# Patient Record
Sex: Male | Born: 1950 | Race: White | Hispanic: No | Marital: Married | Smoking: Former smoker
Health system: Southern US, Community
[De-identification: ages and names within clinical notes are randomized; demographics above are authoritative.]

## PROBLEM LIST (undated history)

## (undated) DIAGNOSIS — B029 Zoster without complications: Secondary | ICD-10-CM

## (undated) DIAGNOSIS — Q2730 Arteriovenous malformation, site unspecified: Secondary | ICD-10-CM

## (undated) DIAGNOSIS — K8020 Calculus of gallbladder without cholecystitis without obstruction: Secondary | ICD-10-CM

## (undated) DIAGNOSIS — N210 Calculus in bladder: Secondary | ICD-10-CM

## (undated) DIAGNOSIS — E785 Hyperlipidemia, unspecified: Secondary | ICD-10-CM

## (undated) DIAGNOSIS — N281 Cyst of kidney, acquired: Secondary | ICD-10-CM

## (undated) DIAGNOSIS — I700 Atherosclerosis of aorta: Secondary | ICD-10-CM

## (undated) DIAGNOSIS — K5790 Diverticulosis of intestine, part unspecified, without perforation or abscess without bleeding: Secondary | ICD-10-CM

## (undated) DIAGNOSIS — I10 Essential (primary) hypertension: Secondary | ICD-10-CM

## (undated) DIAGNOSIS — Z87442 Personal history of urinary calculi: Secondary | ICD-10-CM

## (undated) HISTORY — DX: Atherosclerosis of aorta: I70.0

## (undated) HISTORY — DX: Hyperlipidemia, unspecified: E78.5

## (undated) HISTORY — DX: Calculus of gallbladder without cholecystitis without obstruction: K80.20

## (undated) HISTORY — PX: WISDOM TOOTH EXTRACTION: SHX21

## (undated) HISTORY — DX: Calculus in bladder: N21.0

## (undated) HISTORY — DX: Cyst of kidney, acquired: N28.1

## (undated) HISTORY — PX: TORSION APPENDIX EXCISION OF INFRACTED EPIPLOIC: SHX6127

## (undated) HISTORY — DX: Diverticulosis of intestine, part unspecified, without perforation or abscess without bleeding: K57.90

## (undated) HISTORY — DX: Essential (primary) hypertension: I10

## (undated) HISTORY — DX: Arteriovenous malformation, site unspecified: Q27.30

## (undated) HISTORY — DX: Zoster without complications: B02.9

---

## 1982-06-10 HISTORY — PX: OTHER SURGICAL HISTORY: SHX169

## 1999-07-04 ENCOUNTER — Ambulatory Visit (HOSPITAL_BASED_OUTPATIENT_CLINIC_OR_DEPARTMENT_OTHER): Admission: RE | Admit: 1999-07-04 | Discharge: 1999-07-04 | Payer: Self-pay | Admitting: Orthopedic Surgery

## 2000-06-24 ENCOUNTER — Encounter: Admission: RE | Admit: 2000-06-24 | Discharge: 2000-06-24 | Payer: Self-pay | Admitting: Otolaryngology

## 2000-06-24 ENCOUNTER — Encounter: Payer: Self-pay | Admitting: Otolaryngology

## 2001-09-03 ENCOUNTER — Encounter: Admission: RE | Admit: 2001-09-03 | Discharge: 2001-09-03 | Payer: Self-pay | Admitting: Family Medicine

## 2001-09-03 ENCOUNTER — Encounter: Payer: Self-pay | Admitting: Family Medicine

## 2003-04-04 ENCOUNTER — Ambulatory Visit (HOSPITAL_BASED_OUTPATIENT_CLINIC_OR_DEPARTMENT_OTHER): Admission: RE | Admit: 2003-04-04 | Discharge: 2003-04-04 | Payer: Self-pay | Admitting: Orthopedic Surgery

## 2003-04-04 ENCOUNTER — Ambulatory Visit (HOSPITAL_COMMUNITY): Admission: RE | Admit: 2003-04-04 | Discharge: 2003-04-04 | Payer: Self-pay | Admitting: Orthopedic Surgery

## 2003-06-11 HISTORY — PX: KNEE SURGERY: SHX244

## 2005-09-25 ENCOUNTER — Encounter: Admission: RE | Admit: 2005-09-25 | Discharge: 2005-09-25 | Payer: Self-pay | Admitting: Family Medicine

## 2008-04-08 ENCOUNTER — Encounter: Admission: RE | Admit: 2008-04-08 | Discharge: 2008-04-08 | Payer: Self-pay | Admitting: Family Medicine

## 2009-10-13 IMAGING — CR DG LUMBAR SPINE COMPLETE 4+V
5 series · 5 of 5 positions shown · non-contrast
Comparison: None

CLINICAL DATA: Pain in the left mid to low back, no acute injury

LUMBAR SPINE - COMPLETE 4+ VIEW

[view not recorded (1 of 5)]
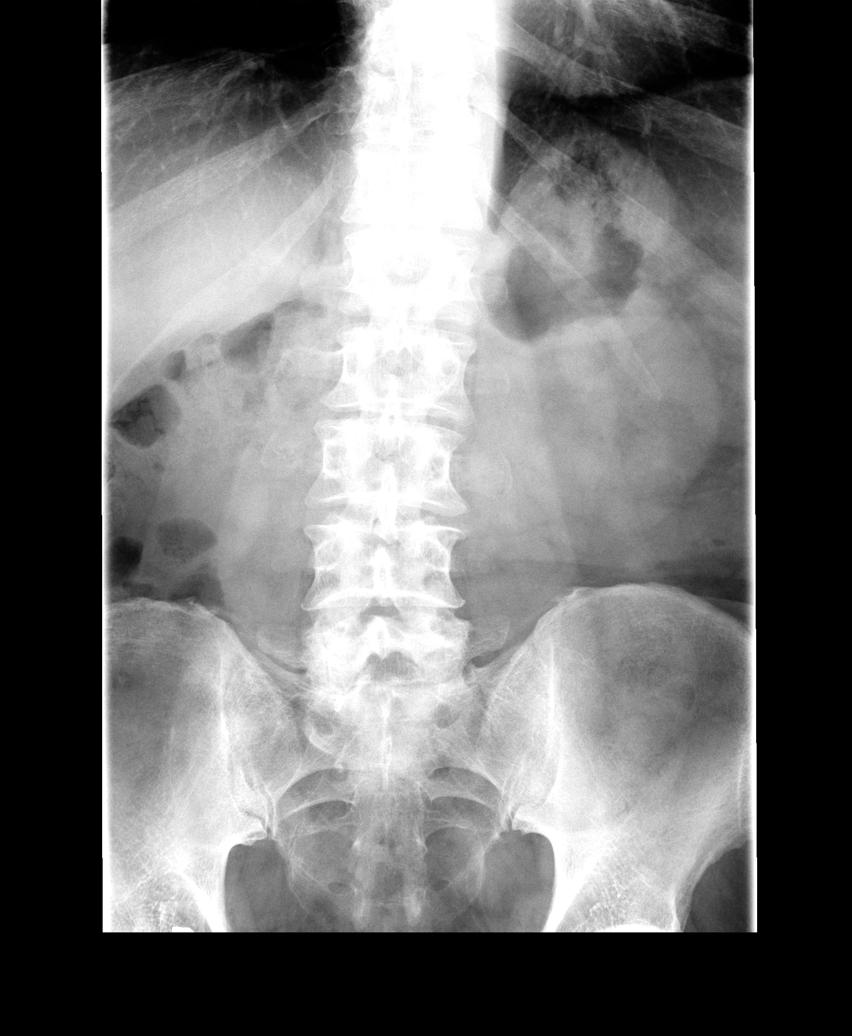

[view not recorded (2 of 5)]
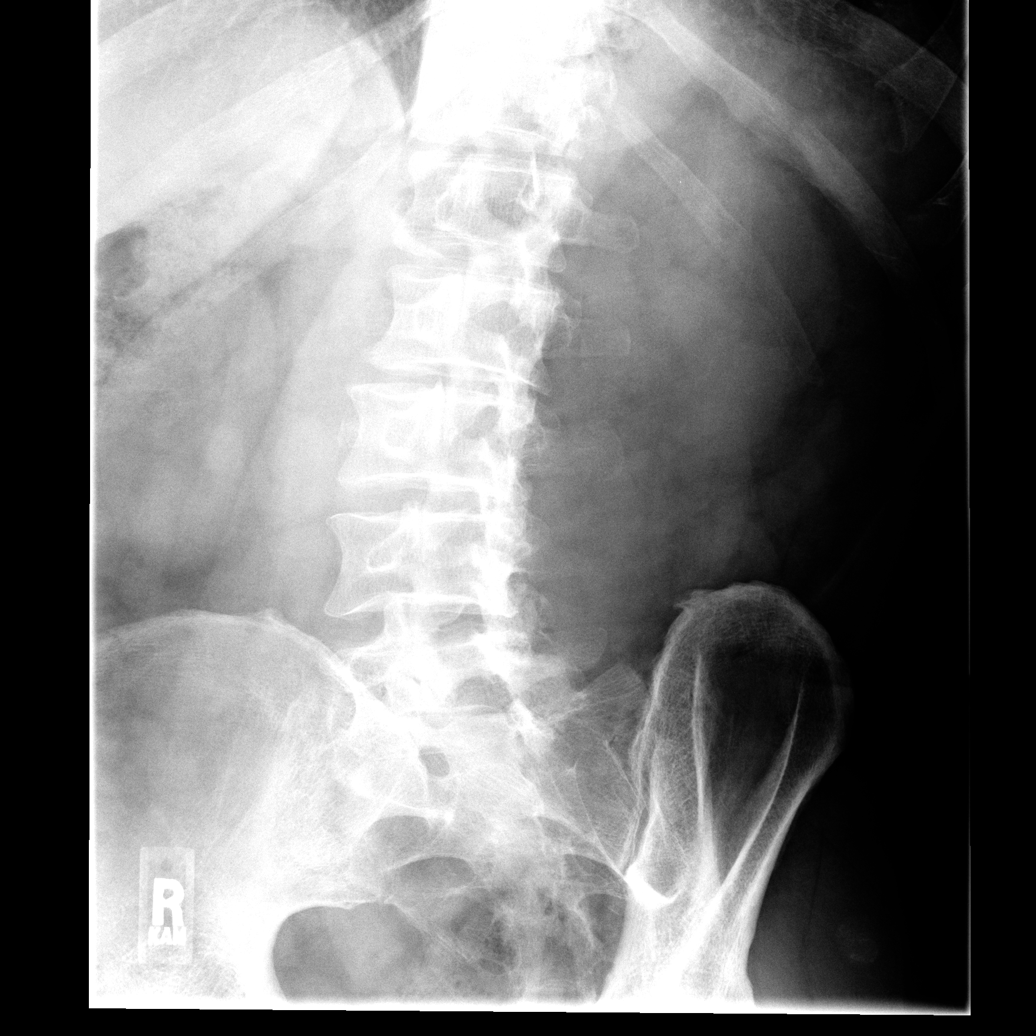

[view not recorded (3 of 5)]
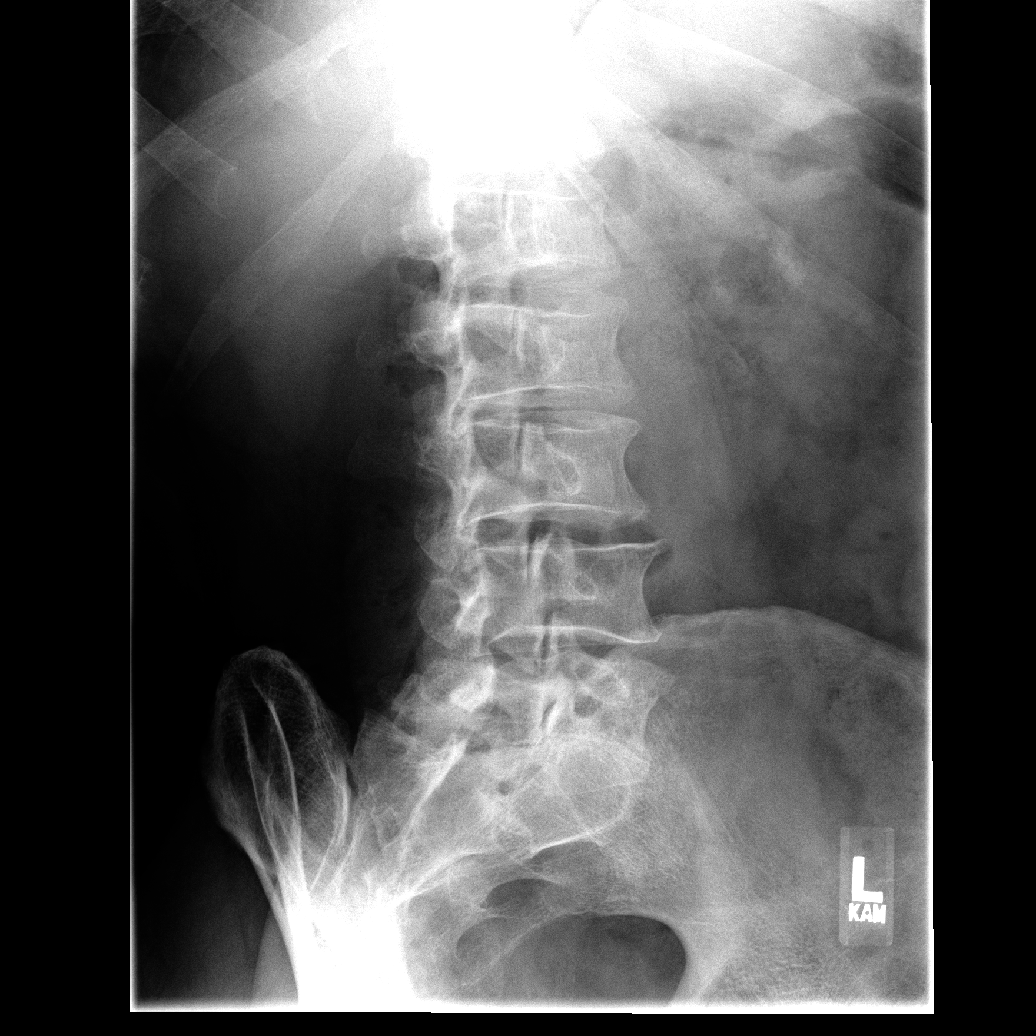

[view not recorded (4 of 5)]
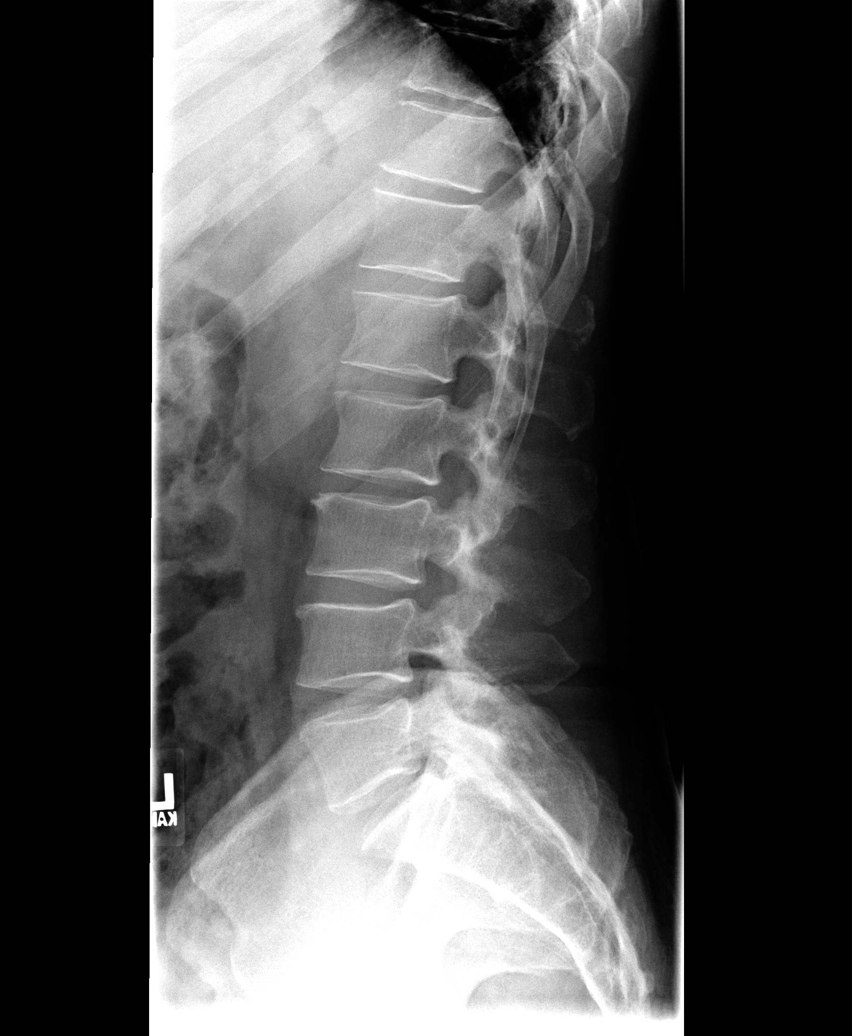

[view not recorded (5 of 5)]
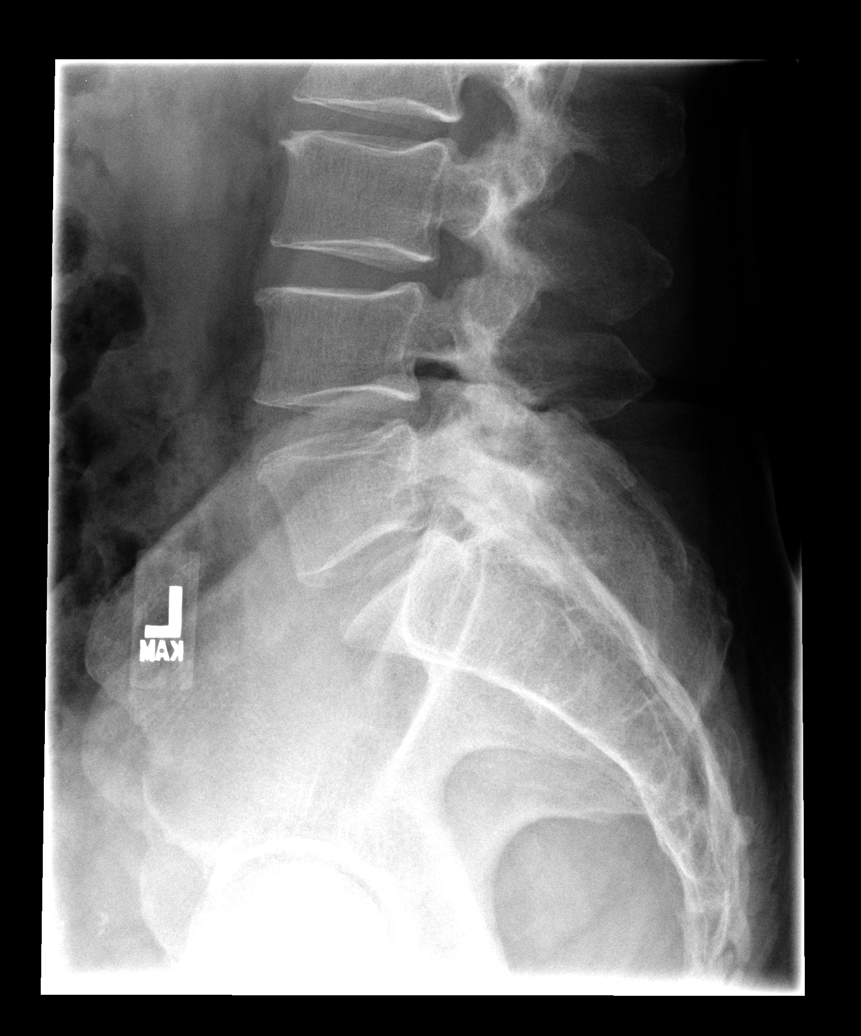

[5 of 5 positions shown; findings below may reference images not displayed]

FINDINGS: The lumbar vertebrae are in normal alignment with normal
disc spaces.  No compression deformity is seen.  The SI joints are
normally corticated.
IMPRESSION: Normal alignment with normal disc spaces.

## 2010-10-26 NOTE — Op Note (Signed)
NAME:  Chris Tucker, Chris Tucker                    ACCOUNT NO.:  [id number]   MEDICAL RECORD NO.:  [id number]                   PATIENT TYPE:  OUT   LOCATION:  [facility]                                 FACILITY:  [facility]   PHYSICIAN:  Feliberto Gottron. Turner Daniels, M.D.                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   OPERATION PERFORMED:  Left knee arthroscopic partial medial meniscectomy,  debridement of chondromalacia.   SURGEON:  Feliberto Gottron. Turner Daniels, M.D.   ASSISTANTLaural Benes. Jannet Mantis.   ANESTHESIA:  Local with intravenous sedation.   ESTIMATED BLOOD LOSS:  Minimal.   FLUIDS REPLACED:  700 mL crystalloids.   DRAINS:  None.   TOURNIQUET TIME:  None.   INDICATIONS FOR PROCEDURE:  The patient is a 60 year old gentleman with a  symptomatic medial meniscal tear of his left knee with effusion.  He has  failed conservative treatment with anti-inflammatory medicine, stretching  exercises and observation.  Because of chronic catching popping, and pain,  he desires elective arthroscopic evaluation and treatment of his left knee  which also has some loss of articular cartilage height on the standing x-  rays, missing about 20 to 30% of the articular cartilage on the standing  films with some peripheral osteophytes.   DESCRIPTION OF PROCEDURE:  The patient was identified by arm band and take  to the operating room at [facility].  Appropriate  anesthetic monitors were attached and local anesthesia with intravenous  sedation was induced with the patient in supine position.  Lateral post was  applied to the table and the left lower extremity was prepped and draped in  the usual sterile fashion from the ankle to the midthigh.  Using a #11  blade, standard inferomedial and inferolateral peripatellar portals were  made allowing introduction of the arthroscope through the inferolateral  portal, the outflow through the inferomedial portal.  Diagnostic arthroscopy  did reveal some grade 2 to 3 chondromalacia of the patella which was lightly  debrided.  The medial femoral condyle did have some flap tears grade 3 to 4  focal over about a 1 cm squared area which were also debrided.  Moving into  the medial compartment itself, we identified a complex degenerative tearing  of the posterior horn of the medial meniscus and this was debrided back to a  stable margin, the anterior cruciate ligament and the posterior cruciate  ligament were noted to be intact.  On the lateral side, minimal  chondromalacia was noted requiring minimal debridement  with 3.5 Gator sucker  shaver which had been used on the other side as well.  At this point the  arthroscope was taken to the medial and lateral gutter clearing these areas  as well.  Small bits of articular cartilage were noted in the joint fluid  and these were continuously lavaged out during the procedure.  We also  visualized the posterior horns of the menisci taking the scope to the medial  and lateral side of the PCL and getting a good look at these two structures.  At this point the knee was washed out with normal saline solution.  The  arthroscopic instruments were removed.  A dressing of Xeroform, 4 x 4  dressing sponges, Webril and Ace wrap was applied.  The patient was awakened  and taken to the recovery room without difficulty.                                               Feliberto Gottron. Turner Daniels, M.D.    Frithjof.Angelica  D:  05/16/2003  T:  05/16/2003  Job:  [id number]

## 2012-07-13 ENCOUNTER — Encounter: Payer: Self-pay | Admitting: Gastroenterology

## 2012-07-21 ENCOUNTER — Encounter: Payer: Self-pay | Admitting: Gastroenterology

## 2012-07-21 ENCOUNTER — Ambulatory Visit (INDEPENDENT_AMBULATORY_CARE_PROVIDER_SITE_OTHER): Payer: 59 | Admitting: Gastroenterology

## 2012-07-21 VITALS — BP 150/90 | HR 80 | Ht 67.0 in | Wt 187.0 lb

## 2012-07-21 DIAGNOSIS — K5900 Constipation, unspecified: Secondary | ICD-10-CM

## 2012-07-21 MED ORDER — PSYLLIUM 58.6 % PO POWD: 1.0000 | Freq: Every day | ORAL | Status: DC

## 2012-07-21 NOTE — Patient Instructions (Addendum)
Please purchase Metamucil over the counter. Take as directed.  Please read information below about constipation and high fiber diet.  Follow up in one year.  ________________________________________________________________________  Constipation, Adult Constipation is when a person has fewer than 3 bowel movements a week; has difficulty having a bowel movement; or has stools that are dry, hard, or larger than normal. As people grow older, constipation is more common. If you try to fix constipation with medicines that make you have a bowel movement (laxatives), the problem may get worse. Long-term laxative use may cause the muscles of the colon to become weak. A low-fiber diet, not taking in enough fluids, and taking certain medicines may make constipation worse. CAUSES   Certain medicines, such as antidepressants, pain medicine, iron supplements, antacids, and water pills.   Certain diseases, such as diabetes, irritable bowel syndrome (IBS), thyroid disease, or depression.   Not drinking enough water.   Not eating enough fiber-rich foods.   Stress or travel.  Lack of physical activity or exercise.  Not going to the restroom when there is the urge to have a bowel movement.  Ignoring the urge to have a bowel movement.  Using laxatives too much. SYMPTOMS   Having fewer than 3 bowel movements a week.   Straining to have a bowel movement.   Having hard, dry, or larger than normal stools.   Feeling full or bloated.   Pain in the lower abdomen.  Not feeling relief after having a bowel movement. DIAGNOSIS  Your caregiver will take a medical history and perform a physical exam. Further testing may be done for severe constipation. Some tests may include:   A barium enema X-ray to examine your rectum, colon, and sometimes, your small intestine.  A sigmoidoscopy to examine your lower colon.  A colonoscopy to examine your entire colon. TREATMENT  Treatment will depend on  the severity of your constipation and what is causing it. Some dietary treatments include drinking more fluids and eating more fiber-rich foods. Lifestyle treatments may include regular exercise. If these diet and lifestyle recommendations do not help, your caregiver may recommend taking over-the-counter laxative medicines to help you have bowel movements. Prescription medicines may be prescribed if over-the-counter medicines do not work.  HOME CARE INSTRUCTIONS   Increase dietary fiber in your diet, such as fruits, vegetables, whole grains, and beans. Limit high-fat and processed sugars in your diet, such as [place] fries, hamburgers, cookies, candies, and soda.   A fiber supplement may be added to your diet if you cannot get enough fiber from foods.   Drink enough fluids to keep your urine clear or pale yellow.   Exercise regularly or as directed by your caregiver.   Go to the restroom when you have the urge to go. Do not hold it.  Only take medicines as directed by your caregiver. Do not take other medicines for constipation without talking to your caregiver first. SEEK IMMEDIATE MEDICAL CARE IF:   You have bright red blood in your stool.   Your constipation lasts for more than 4 days or gets worse.   You have abdominal or rectal pain.   You have thin, pencil-like stools.  You have unexplained weight loss. MAKE SURE YOU:   Understand these instructions.  Will watch your condition.  Will get help right away if you are not doing well or get worse. Document Released: 02/23/2004 Document Revised: 08/19/2011 Document Reviewed: 04/30/2011 [employer] Patient Information 2013 [employer], [place]. _____________________________________________________________________________________________  High-Fiber Diet Fiber is found  in fruits, vegetables, and grains. A high-fiber diet encourages the addition of more whole grains, legumes, fruits, and vegetables in your diet. The recommended amount  of fiber for adult males is 38 g per day. For adult females, it is 25 g per day. Pregnant and lactating women should get 28 g of fiber per day. If you have a digestive or bowel problem, ask your caregiver for advice before adding high-fiber foods to your diet. Eat a variety of high-fiber foods instead of only a select few type of foods.  PURPOSE  To increase stool bulk.  To make bowel movements more regular to prevent constipation.  To lower cholesterol.  To prevent overeating. WHEN IS THIS DIET USED?  It may be used if you have constipation and hemorrhoids.  It may be used if you have uncomplicated diverticulosis (intestine condition) and irritable bowel syndrome.  It may be used if you need help with weight management.  It may be used if you want to add it to your diet as a protective measure against atherosclerosis, diabetes, and cancer. SOURCES OF FIBER  Whole-grain breads and cereals.  Fruits, such as apples, oranges, bananas, berries, prunes, and pears.  Vegetables, such as green peas, carrots, sweet potatoes, beets, broccoli, cabbage, spinach, and artichokes.  Legumes, such split peas, soy, lentils.  Almonds. FIBER CONTENT IN FOODS Starches and Grains / Dietary Fiber (g)  Cheerios, 1 cup / 3 g  Corn Flakes cereal, 1 cup / 0.7 g  Rice crispy treat cereal, 1 cup / 0.3 g  Instant oatmeal (cooked),  cup / 2 g  Frosted wheat cereal, 1 cup / 5.1 g  Brown, long-grain rice (cooked), 1 cup / 3.5 g  White, long-grain rice (cooked), 1 cup / 0.6 g  Enriched macaroni (cooked), 1 cup / 2.5 g Legumes / Dietary Fiber (g)  Baked beans (canned, plain, or vegetarian),  cup / 5.2 g  Kidney beans (canned),  cup / 6.8 g  Pinto beans (cooked),  cup / 5.5 g Breads and Crackers / Dietary Fiber (g)  Plain or honey graham crackers, 2 squares / 0.7 g  Saltine crackers, 3 squares / 0.3 g  Plain, salted pretzels, 10 pieces / 1.8 g  Whole-wheat bread, 1 slice / 1.9  g  White bread, 1 slice / 0.7 g  Raisin bread, 1 slice / 1.2 g  Plain bagel, 3 oz / 2 g  Flour tortilla, 1 oz / 0.9 g  Corn tortilla, 1 small / 1.5 g  Hamburger or hotdog bun, 1 small / 0.9 g Fruits / Dietary Fiber (g)  Apple with skin, 1 medium / 4.4 g  Sweetened applesauce,  cup / 1.5 g  Banana,  medium / 1.5 g  Grapes, 10 grapes / 0.4 g  Orange, 1 small / 2.3 g  Raisin, 1.5 oz / 1.6 g  Melon, 1 cup / 1.4 g Vegetables / Dietary Fiber (g)  Green beans (canned),  cup / 1.3 g  Carrots (cooked),  cup / 2.3 g  Broccoli (cooked),  cup / 2.8 g  Peas (cooked),  cup / 4.4 g  Mashed potatoes,  cup / 1.6 g  Lettuce, 1 cup / 0.5 g  Corn (canned),  cup / 1.6 g  Tomato,  cup / 1.1 g Document Released: 05/27/2005 Document Revised: 11/26/2011 Document Reviewed: 08/29/2011 [facility] Patient Information 2013 [place], [place].

## 2012-07-21 NOTE — Progress Notes (Signed)
History of Present Illness:  This is a 62 year old Caucasian male referred for evaluation of an acute episode of constipation 2 weeks ago which required several laxatives for relief.  Generalyl this patient has a bowel movement every 3 days,  However during this most recent episode of constipation, he went some 10 days without a bowel movement, but finally had a spontaneous bowel movement with MiraLax and magnesium citrate.  Throughout this period he had no abdominal pain, distention, nausea vomiting, or other GI complaints.  He follows a regular diet and denies any food intolerances, anorexia, weight loss, history of hepatitis or pancreatitis.  He allegedly uses liberal by mouth fluids.  He has not had previous colonoscopy or barium enemas.  Review of notes from Dr. Ricki Miller shows a normal recent rectal exam was Hemoccult negative cards.  Labs showed normal CBC and metabolic profile.  Family history is noncontributory.  Patient denies abuse of alcohol, cigarettes, or NSAIDs.  I have reviewed this patient's present history, medical and surgical past history, allergies and medications.     ROS:   All systems were reviewed and are negative unless otherwise stated in the HPI.  Allergies  Allergen Reactions  . Statins     Flulike symptoms   Outpatient Prescriptions Prior to Visit  Medication Sig Dispense Refill  . magnesium oxide (MAG-OX) 400 MG tablet Take 400 mg by mouth daily.      . Multiple Vitamins-Minerals (MULTI FOR HIM 50+) TABS Take 1 tablet by mouth daily.      Marland Kitchen olmesartan (BENICAR) 40 MG tablet Take 20 mg by mouth as directed.       . OMEGA 3 1000 MG CAPS Take 1 capsule by mouth daily.      . Red Yeast Rice 600 MG CAPS Take 1 capsule by mouth daily.      . Tamsulosin HCl (FLOMAX) 0.4 MG CAPS Take 0.4 mg by mouth as directed.      . bisacodyl (DULCOLAX) 5 MG EC tablet Take 5 mg by mouth as directed.       No facility-administered medications prior to visit.   Past Medical History   Diagnosis Date  . Hypertension   . Hyperlipemia   . Shingles    Past Surgical History  Procedure Laterality Date  . Knee surgery  2005  . Torsion appendix excision of infracted epiploic    . Wisdom tooth extraction     History   Social History  . Marital Status: Single    Spouse Name: N/A    Number of Children: 1  . Years of Education: N/A   Occupational History  . Retired    Social History Main Topics  . Smoking status: Former Games developer  . Smokeless tobacco: Never Used  . Alcohol Use: Yes  . Drug Use: No  . Sexually Active: None   Other Topics Concern  . None   Social History Narrative  . None   Family History  Problem Relation Age of Onset  . Bone cancer Father   . Ovarian cancer Mother        Physical Exam: Blood pressure 150/90, pulse 80 and regular, weight 187 with a BMI of 29.28. General well developed well nourished patient in no acute distress, appearing their stated age Eyes PERRLA, no icterus, fundoscopic exam per opthamologist Skin no lesions noted Neck supple, no adenopathy, no thyroid enlargement, no tenderness Chest clear to percussion and auscultation Heart no significant murmurs, gallops or rubs noted Abdomen no hepatosplenomegaly masses or  tenderness, BS normal. . Extremities no acute joint lesions, edema, phlebitis or evidence of cellulitis. Neurologic patient oriented x 3, cranial nerves intact, no focal neurologic deficits noted. Psychological mental status normal and normal affect.  Assessment and plan: This patient is comfortable with a bowel movement every 3 days which has been normal for him for some time.  I am not exactly sure what caused his most recent episode of acute constipation, and I've advised him to follow a high fiber diet with daily Metamucil and liberal by mouth fluids.  He refuses colonoscopy exam because of complication that his brother had at the time of his colonoscopy.  I have reviewed  Other possible tests to exclude  colon cancer as barium enema or CT colonoscopy, and the patient is content with his current condition.  I've advised him to call me if his constipation worsens or does not improve with Metamucil and liberal by mouth fluids.  I again have explained to him his 5% chance of colon cancer during his lifetime, and a 20-25% possibility of colonic polyposis at his age.  His continue his medical followup with Dr. Ricki Miller who has provided him with excellent medical care. No diagnosis found.

## 2013-05-14 ENCOUNTER — Ambulatory Visit (INDEPENDENT_AMBULATORY_CARE_PROVIDER_SITE_OTHER): Payer: 59 | Admitting: Gastroenterology

## 2013-05-14 ENCOUNTER — Encounter: Payer: Self-pay | Admitting: Gastroenterology

## 2013-05-14 VITALS — BP 130/80 | HR 102 | Ht 67.0 in | Wt 189.4 lb

## 2013-05-14 DIAGNOSIS — K5900 Constipation, unspecified: Secondary | ICD-10-CM

## 2013-05-14 DIAGNOSIS — R109 Unspecified abdominal pain: Secondary | ICD-10-CM

## 2013-05-14 MED ORDER — LINACLOTIDE 145 MCG PO CAPS: 145.0000 ug | ORAL_CAPSULE | Freq: Every day | ORAL | Status: DC

## 2013-05-14 MED ORDER — NA SULFATE-K SULFATE-MG SULF 17.5-3.13-1.6 GM/177ML PO SOLN: ORAL | Status: DC

## 2013-05-14 NOTE — Progress Notes (Signed)
This is a 62 year old Caucasian male who has increasing constipation over the last 10-12 months with some associated left upper quadrant discomfort.  He recently was taken off of Benicar and placed on a beta blocker for his blood pressure control, and says that his bowels are doing better.  He's had some bright red blood locally, but denies blood mixed with his stool.  He has had great reservations about colonoscopy because of ache colonic perforation that occurred in his brother at the time of his brothers colonoscopy.  He now relates that he has" gotten over that", and would like colonoscopy exam.  He continues to GERD several days to week between bowel movements, but denies nausea vomiting, upper GI or hepatobiliary complaints.  He tries to follow a high fiber diet and drinks liberal by mouth fluids.  Again, he has not had colonoscopy other types of studies of his abdomen such as CT scans or barium enemas.  Is not on narcotics or other constipation medications.  No other symptoms of neuromuscular dysfunction or neurological difficulties.  Current Medications, Allergies, Past Medical History, Past Surgical History, Family History and Social History were reviewed in [employer] record.  ROS: All systems were reviewed and are negative unless otherwise stated in the HPI.          Physical Exam: Awake and alert appears stated age.  Blood pressure 130/80, pulse 102 and weight 189 with a BMI of 29.66.  His abdomen shows no distention, organomegaly, masses or tenderness.  Specks of rectum is unremarkable as is rectal exam with good rectal tone.  There is soft stool present which is guaiac negative.  Mental status is normal    Assessment and Plan: Chronic functional constipation in a 62 year old Caucasian male, probable diverticulosis, rule out colon carcinoma.  I've scheduled him for diagnostic colonoscopy, have encouraged him to continue his high fiber diet, have added Benefiber 1  tablespoon with his food twice a day, a trial of Linzess 145 mcg a day, and we will proceed accordingly.  His continue his other medications as per primary care.  He relates that he had recent labs done in July which were all normal.  I cannot find any information concerning constipation association with Benicar use.

## 2013-05-14 NOTE — Patient Instructions (Addendum)
You have been scheduled for a colonoscopy with propofol. Please follow written instructions given to you at your visit today.  Please pick up your prep kit at the pharmacy within the next 1-3 days. If you use inhalers (even only as needed), please bring them with you on the day of your procedure. Your physician has requested that you go to www.startemmi.com and enter the access code given to you at your visit today. This web site gives a general overview about your procedure. However, you should still follow specific instructions given to you by our office regarding your preparation for the procedure..  Please purchase Benefiber over the counter and take twice daily.   We have given you samples of the following medication to take: Linzess 145 mcg, please take one tablet by mouth once daily on an empty stomach

## 2013-05-18 ENCOUNTER — Encounter: Payer: Self-pay | Admitting: Gastroenterology

## 2013-05-28 ENCOUNTER — Ambulatory Visit (AMBULATORY_SURGERY_CENTER): Payer: 59 | Admitting: Gastroenterology

## 2013-05-28 ENCOUNTER — Encounter: Payer: Self-pay | Admitting: Gastroenterology

## 2013-05-28 VITALS — BP 152/76 | HR 54 | Temp 98.0°F | Resp 16 | Ht 67.0 in | Wt 189.0 lb

## 2013-05-28 DIAGNOSIS — R1012 Left upper quadrant pain: Secondary | ICD-10-CM

## 2013-05-28 DIAGNOSIS — K5900 Constipation, unspecified: Secondary | ICD-10-CM

## 2013-05-28 DIAGNOSIS — K5520 Angiodysplasia of colon without hemorrhage: Secondary | ICD-10-CM

## 2013-05-28 DIAGNOSIS — K5730 Diverticulosis of large intestine without perforation or abscess without bleeding: Secondary | ICD-10-CM

## 2013-05-28 DIAGNOSIS — Z1211 Encounter for screening for malignant neoplasm of colon: Secondary | ICD-10-CM

## 2013-05-28 MED ORDER — SODIUM CHLORIDE 0.9 % IV SOLN: 500.0000 mL | INTRAVENOUS | Status: DC

## 2013-05-28 NOTE — Progress Notes (Signed)
Patient did not experience any of the following events: a burn prior to discharge; a fall within the facility; wrong site/side/patient/procedure/implant event; or a hospital transfer or hospital admission upon discharge from the facility. (G8907) Patient did not have preoperative order for IV antibiotic SSI prophylaxis. (G8918)  

## 2013-05-28 NOTE — Patient Instructions (Signed)
Discharge instructions given with verbal understanding. Handouts on diverticulosis and a high fiber diet. Resume previous medications. YOU HAD AN ENDOSCOPIC PROCEDURE TODAY AT THE [facility]: Refer to the procedure report that was given to you for any specific questions about what was found during the examination.  If the procedure report does not answer your questions, please call your gastroenterologist to clarify.  If you requested that your care partner not be given the details of your procedure findings, then the procedure report has been included in a sealed envelope for you to review at your convenience later.  YOU SHOULD EXPECT: Some feelings of bloating in the abdomen. Passage of more gas than usual.  Walking can help get rid of the air that was put into your GI tract during the procedure and reduce the bloating. If you had a lower endoscopy (such as a colonoscopy or flexible sigmoidoscopy) you may notice spotting of blood in your stool or on the toilet paper. If you underwent a bowel prep for your procedure, then you may not have a normal bowel movement for a few days.  DIET: Your first meal following the procedure should be a light meal and then it is ok to progress to your normal diet.  A half-sandwich or bowl of soup is an example of a good first meal.  Heavy or fried foods are harder to digest and may make you feel nauseous or bloated.  Likewise meals heavy in dairy and vegetables can cause extra gas to form and this can also increase the bloating.  Drink plenty of fluids but you should avoid alcoholic beverages for 24 hours.  ACTIVITY: Your care partner should take you home directly after the procedure.  You should plan to take it easy, moving slowly for the rest of the day.  You can resume normal activity the day after the procedure however you should NOT DRIVE or use heavy machinery for 24 hours (because of the sedation medicines used during the test).    SYMPTOMS TO REPORT  IMMEDIATELY: A gastroenterologist can be reached at any hour.  During normal business hours, 8:30 AM to 5:00 PM Monday through Friday, call [phone].  After hours and on weekends, please call the GI answering service at [phone] who will take a message and have the physician on call contact you.   Following lower endoscopy (colonoscopy or flexible sigmoidoscopy):  Excessive amounts of blood in the stool  Significant tenderness or worsening of abdominal pains  Swelling of the abdomen that is new, acute  Fever of 100F or higher FOLLOW UP: If any biopsies were taken you will be contacted by phone or by letter within the next 1-3 weeks.  Call your gastroenterologist if you have not heard about the biopsies in 3 weeks.  Our staff will call the home number listed on your records the next business day following your procedure to check on you and address any questions or concerns that you may have at that time regarding the information given to you following your procedure. This is a courtesy call and so if there is no answer at the home number and we have not heard from you through the emergency physician on call, we will assume that you have returned to your regular daily activities without incident.  SIGNATURES/CONFIDENTIALITY: You and/or your care partner have signed paperwork which will be entered into your electronic medical record.  These signatures attest to the fact that that the information above on your After   Visit Summary has been reviewed and is understood.  Full responsibility of the confidentiality of this discharge information lies with you and/or your care-partner. 

## 2013-05-28 NOTE — Progress Notes (Signed)
Pt very sleepy upon arrival from room. Very difficult to arouse. Wife at bedside and trying to arouse. Pt will open eyes barely and inconsistently follow commands. Tyrone Sage, CRNA in and performed jaw thrust, to which patient arched back and moaned. Dr. Jarold Motto and Kathlene November, CRNA notified of patient condition. CBG requested. Obtained and CBG 82. Dr. Jarold Motto in to assess, pt following commands slowly but with bilateral weak grips, wiggles toes, and slowly answering questions with incomplete words. Pupils equal. IV restarted with fluids wide open. Pt later having some complaints of abdominal pain, slow to express air but finally with success. Dr. Jarold Motto and Kathlene November continue to come in to assess patient regularly and with time patient showing improvement and becoming more appropriately responsive without complaints of pain.

## 2013-05-28 NOTE — Op Note (Signed)
[facility] [address] [place], [address]   COLONOSCOPY PROCEDURE REPORT  PATIENT: Chris Tucker, Chris Tucker  MR#: [id number] BIRTHDATE: [birth year] , 62  yrs. old GENDER: Male ENDOSCOPIST: Mardella Layman, MD, [facility] REFERRED BY: PROCEDURE DATE:  05/28/2013 PROCEDURE:   Colonoscopy, screening First Screening Colonoscopy - Avg.  risk and is 50 yrs.  old or older Yes.  Prior Negative Screening - Now for repeat screening. N/A  History of Adenoma - Now for follow-up colonoscopy & has been > or = to 3 yrs.  N/A ASA CLASS:   Class II INDICATIONS:average risk screening and abdominal pain in the upper left quadrant. MEDICATIONS: Propofol (Diprivan) 230 mg IV  DESCRIPTION OF PROCEDURE:   After the risks benefits and alternatives of the procedure were thoroughly explained, informed consent was obtained.  A digital rectal exam revealed no abnormalities of the rectum.   The LB [id number]  endoscope was introduced through the anus and advanced to the cecum, which was identified by both the appendix and ileocecal valve. No adverse events experienced.   The quality of the prep was excellent, using MoviPrep  The instrument was then slowly withdrawn as the colon was fully examined.      COLON FINDINGS: Diverticulosis was noted.   Mild diverticulosis was noted in the sigmoid colon and descending colon.   A normal appearing cecum, ileocecal valve, and appendiceal orifice were identified.  The ascending, hepatic flexure, transverse, splenic flexure, descending, sigmoid colon and rectum appeared unremarkable.  No polyps or cancers were seen.  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  Retroflexed views revealed no abnormalities. The time to cecum=3 minutes 37 seconds.  Withdrawal time=6 minutes 31 seconds. The scope was withdrawn and the procedure completed. COMPLICATIONS: There were no complications.  ENDOSCOPIC  IMPRESSION: 1.   Incidental cecal AVM 2.   Mild diverticulosis was noted in the sigmoid colon and descending colon 3.   Normal colon...no polyps noted  RECOMMENDATIONS: 1.  High fiber diet with liberal fluid intake. 2.  Metamucil or benefiber 3.  Repeat Colonscopy in 10 years.   eSigned:  Mardella Layman, MD, [facility] 05/28/2013 1:53 PM   cc:

## 2013-05-28 NOTE — Progress Notes (Signed)
Pt. States he has had had vague discomfort left upper quadrant below ribs for approx. 3 weeks.

## 2013-05-28 NOTE — Progress Notes (Signed)
Procedure ends, to recovery, report given and VSS. 

## 2013-05-28 NOTE — Progress Notes (Signed)
Patient sitting up on side of bed. Alert and oriented. Answer questions appropriated. Color pink. Patient and wife having a one on one conversation. Able to move all extremities with any difficulty. Grip strength equal. Received 600cc of NS.

## 2013-05-31 ENCOUNTER — Telehealth: Payer: Self-pay | Admitting: *Deleted

## 2013-05-31 NOTE — Telephone Encounter (Signed)
  Follow up Call-  Call back number 05/28/2013  Post procedure Call Back phone  # [phone]  Permission to leave phone message Yes     Patient questions:  Do you have a fever, pain , or abdominal swelling? no Pain Score  0 *  Have you tolerated food without any problems? yes  Have you been able to return to your normal activities? yes  Do you have any questions about your discharge instructions: Diet   no Medications  no Follow up visit  no  Do you have questions or concerns about your Care? no  Actions: * If pain score is 4 or above: No action needed, pain <4.

## 2013-08-25 ENCOUNTER — Other Ambulatory Visit: Payer: Self-pay | Admitting: Internal Medicine

## 2013-08-25 ENCOUNTER — Ambulatory Visit
Admission: RE | Admit: 2013-08-25 | Discharge: 2013-08-25 | Disposition: A | Discharge status: Home / Self Care | Payer: 59 | Source: Ambulatory Visit | Attending: Internal Medicine | Admitting: Internal Medicine

## 2013-08-25 DIAGNOSIS — N50812 Left testicular pain: Secondary | ICD-10-CM

## 2013-08-25 DIAGNOSIS — N4400 Torsion of testis, unspecified: Secondary | ICD-10-CM

## 2014-02-01 ENCOUNTER — Other Ambulatory Visit: Payer: Self-pay | Admitting: Internal Medicine

## 2014-02-01 ENCOUNTER — Ambulatory Visit
Admission: RE | Admit: 2014-02-01 | Discharge: 2014-02-01 | Disposition: A | Discharge status: Home / Self Care | Payer: 59 | Source: Ambulatory Visit | Attending: Internal Medicine | Admitting: Internal Medicine

## 2014-02-01 DIAGNOSIS — W19XXXA Unspecified fall, initial encounter: Secondary | ICD-10-CM

## 2014-07-09 ENCOUNTER — Emergency Department (HOSPITAL_COMMUNITY)
Admission: EM | Admit: 2014-07-09 | Discharge: 2014-07-09 | Disposition: A | Discharge status: Home / Self Care | Payer: 59 | Attending: Emergency Medicine | Admitting: Emergency Medicine

## 2014-07-09 ENCOUNTER — Emergency Department (HOSPITAL_COMMUNITY): Payer: 59

## 2014-07-09 ENCOUNTER — Encounter (HOSPITAL_COMMUNITY): Payer: Self-pay

## 2014-07-09 DIAGNOSIS — N23 Unspecified renal colic: Secondary | ICD-10-CM | POA: Diagnosis not present

## 2014-07-09 DIAGNOSIS — I10 Essential (primary) hypertension: Secondary | ICD-10-CM | POA: Diagnosis not present

## 2014-07-09 DIAGNOSIS — Z87891 Personal history of nicotine dependence: Secondary | ICD-10-CM | POA: Insufficient documentation

## 2014-07-09 DIAGNOSIS — Z79899 Other long term (current) drug therapy: Secondary | ICD-10-CM | POA: Insufficient documentation

## 2014-07-09 DIAGNOSIS — R109 Unspecified abdominal pain: Secondary | ICD-10-CM

## 2014-07-09 DIAGNOSIS — Z8619 Personal history of other infectious and parasitic diseases: Secondary | ICD-10-CM | POA: Diagnosis not present

## 2014-07-09 DIAGNOSIS — E785 Hyperlipidemia, unspecified: Secondary | ICD-10-CM | POA: Insufficient documentation

## 2014-07-09 LAB — I-STAT CHEM 8, ED
BUN: 20 mg/dL (ref 6–23)
CALCIUM ION: 1.1 mmol/L — AB (ref 1.13–1.30)
CHLORIDE: 108 mmol/L (ref 96–112)
CREATININE: 1.2 mg/dL (ref 0.50–1.35)
GLUCOSE: 110 mg/dL — AB (ref 70–99)
HCT: 47 % (ref 39.0–52.0)
HEMOGLOBIN: 16 g/dL (ref 13.0–17.0)
Potassium: 3.9 mmol/L (ref 3.5–5.1)
SODIUM: 142 mmol/L (ref 135–145)
TCO2: 20 mmol/L (ref 0–100)

## 2014-07-09 LAB — URINALYSIS, ROUTINE W REFLEX MICROSCOPIC
Bilirubin Urine: NEGATIVE
GLUCOSE, UA: NEGATIVE mg/dL
KETONES UR: NEGATIVE mg/dL
Leukocytes, UA: NEGATIVE
Nitrite: NEGATIVE
PROTEIN: NEGATIVE mg/dL
Specific Gravity, Urine: 1.026 (ref 1.005–1.030)
UROBILINOGEN UA: 0.2 mg/dL (ref 0.0–1.0)
pH: 5.5 (ref 5.0–8.0)

## 2014-07-09 LAB — URINE MICROSCOPIC-ADD ON

## 2014-07-09 MED ORDER — OXYCODONE-ACETAMINOPHEN 5-325 MG PO TABS: 1.0000 | ORAL_TABLET | Freq: Four times a day (QID) | ORAL | Status: DC | PRN

## 2014-07-09 MED ORDER — METOCLOPRAMIDE HCL 10 MG PO TABS: 10.0000 mg | ORAL_TABLET | Freq: Four times a day (QID) | ORAL | Status: DC | PRN

## 2014-07-09 NOTE — ED Notes (Signed)
Pt sts that he started to have L flank pain that started approx 2 hrs PTA. Pt denies hematuria or difficulty urinating. Pt is A&O and in NAD, Dr Shela CommonsJ at bedside

## 2014-07-09 NOTE — Discharge Instructions (Signed)
Kidney Stones Take Advil for mild pain or the pain medicine prescribed for bad pain. You've also been prescribed medicine for nausea. Call Dr.Manny's office in 2 days to arrange to be seen within the next 7-10 days, or the next available appointment. Kidney stones (urolithiasis) are solid masses that form inside your kidneys. The intense pain is caused by the stone moving through the kidney, ureter, bladder, and urethra (urinary tract). When the stone moves, the ureter starts to spasm around the stone. The stone is usually passed in your pee (urine).  HOME CARE  Drink enough fluids to keep your pee clear or pale yellow. This helps to get the stone out.  Strain all pee through the provided strainer. Do not pee without peeing through the strainer, not even once. If you pee the stone out, catch it in the strainer. The stone may be as small as a grain of salt. Take this to your doctor. This will help your doctor figure out what you can do to try to prevent more kidney stones.  Only take medicine as told by your doctor.  Follow up with your doctor as told.  Get follow-up X-rays as told by your doctor. GET HELP IF: You have pain that gets worse even if you have been taking pain medicine. GET HELP RIGHT AWAY IF:   Your pain does not get better with medicine.  You have a fever or shaking chills.  Your pain increases and gets worse over 18 hours.  You have new belly (abdominal) pain.  You feel faint or pass out.  You are unable to pee. MAKE SURE YOU:   Understand these instructions.  Will watch your condition.  Will get help right away if you are not doing well or get worse. Document Released: 11/13/2007 Document Revised: 01/27/2013 Document Reviewed: 10/28/2012 [facility] Patient Information 2015 [employer] [place]. This information is not intended to replace advice given to you by your health care provider. Make sure you discuss any questions you have with your health care provider.

## 2014-07-09 NOTE — ED Provider Notes (Signed)
CSN: [id number]     Arrival date & time 07/09/14  0757 History   First MD Initiated Contact with Patient 07/09/14 0802     Chief Complaint  Patient presents with  . Flank Pain  . Nausea     (Consider location/radiation/quality/duration/timing/severity/associated sxs/prior Treatment) HPI Complains of sudden onset left flank pain, nonradiating onset 6:15 AM today. Associated symptoms include nausea. No vomiting. No other associated symptoms. Nothing made pain better or worse. Last bowel movement 2 or 3 days ago, normal. No treatment prior to coming here. Pain has much improved and is now almost gone, without treatment. Nausea has resolved. Past Medical History  Diagnosis Date  . Hypertension   . Hyperlipemia   . Shingles    Past Surgical History  Procedure Laterality Date  . Knee surgery  2005  . Torsion appendix excision of infracted epiploic    . Wisdom tooth extraction    . 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 Bone cancer Father   . Ovarian cancer Mother    History  Substance Use Topics  . Smoking status: Former Games developer  . Smokeless tobacco: Never Used  . Alcohol Use: Yes     Comment: occasionally    no illicit drug use Review of Systems  Constitutional: Negative.   HENT: Negative.   Respiratory: Negative.   Cardiovascular: Negative.   Gastrointestinal: Positive for nausea.  Genitourinary: Positive for flank pain.  Musculoskeletal: Negative.   Skin: Negative.   Neurological: Negative.   Psychiatric/Behavioral: Negative.   All other systems reviewed and are negative.     Allergies  Linzess and Statins  Home Medications   Prior to Admission medications   Medication Sig Start Date End Date Taking? Authorizing Provider  metoprolol succinate (TOPROL-XL) 25 MG 24 hr tablet  05/04/13   Historical Provider, MD  metoprolol tartrate (LOPRESSOR) 25 MG tablet Take 25 mg by mouth  daily.    Historical Provider, MD  Multiple Vitamins-Minerals (MULTI FOR HIM 50+) TABS Take 1 tablet by mouth daily.    Historical Provider, MD  OMEGA 3 1000 MG CAPS Take 1 capsule by mouth daily.    Historical Provider, MD  Red Yeast Rice 600 MG CAPS Take 1 capsule by mouth daily.    Historical Provider, MD  Tamsulosin HCl (FLOMAX) 0.4 MG CAPS Take 0.4 mg by mouth as directed.    Historical Provider, MD   There were no vitals taken for this visit. Physical Exam  Constitutional: He appears well-developed and well-nourished. No distress.  HENT:  Head: Normocephalic and atraumatic.  Eyes: Conjunctivae are normal. Pupils are equal, round, and reactive to light.  Neck: Neck supple. No tracheal deviation present. No thyromegaly present.  Cardiovascular: Normal rate and regular rhythm.   No murmur heard. Pulmonary/Chest: Effort normal and breath sounds normal.  Abdominal: Soft. Bowel sounds are normal. He exhibits no distension. There is no tenderness.  Genitourinary: Penis normal.  Scrotum normal. No flank tenderness  Musculoskeletal: Normal range of motion. He exhibits no edema or tenderness.  Neurological: He is alert. Coordination normal.  Skin: Skin is warm and dry. No rash noted.  Psychiatric: He has a normal mood and affect.  Nursing note and vitals reviewed.   ED Course  Procedures (including critical care time) Labs Review Labs Reviewed - No data to display  Imaging Review No results found.   EKG Interpretation None     Declines  pain medicine or nausea medicine  9:30 AM patient resting comfortably. Continues to decline antiemetics or analgesics. Results for orders placed or performed during the hospital encounter of 07/09/14  Urinalysis with microscopic  Result Value Ref Range   Color, Urine YELLOW YELLOW   APPearance CLEAR CLEAR   Specific Gravity, Urine 1.026 1.005 - 1.030   pH 5.5 5.0 - 8.0   Glucose, UA NEGATIVE NEGATIVE mg/dL   Hgb urine dipstick MODERATE (A)  NEGATIVE   Bilirubin Urine NEGATIVE NEGATIVE   Ketones, ur NEGATIVE NEGATIVE mg/dL   Protein, ur NEGATIVE NEGATIVE mg/dL   Urobilinogen, UA 0.2 0.0 - 1.0 mg/dL   Nitrite NEGATIVE NEGATIVE   Leukocytes, UA NEGATIVE NEGATIVE  Urine microscopic-add on  Result Value Ref Range   Squamous Epithelial / LPF RARE RARE   WBC, UA 0-2 <3 WBC/hpf   RBC / HPF 21-50 <3 RBC/hpf   Bacteria, UA RARE RARE   Urine-Other MUCOUS PRESENT   I-stat chem 8, ed  Result Value Ref Range   Sodium 142 135 - 145 mmol/L   Potassium 3.9 3.5 - 5.1 mmol/L   Chloride 108 96 - 112 mmol/L   BUN 20 6 - 23 mg/dL   Creatinine, Ser 4.091.20 0.50 - 1.35 mg/dL   Glucose, Bld 811110 (H) 70 - 99 mg/dL   Calcium, Ion 9.141.10 (L) 1.13 - 1.30 mmol/L   TCO2 20 0 - 100 mmol/L   Hemoglobin 16.0 13.0 - 17.0 g/dL   HCT 78.247.0 95.639.0 - 21.352.0 %   Ct Abdomen Pelvis Wo Contrast  07/09/2014   CLINICAL DATA:  Acute onset of left flank pain and nausea. Initial encounter.  EXAM: CT ABDOMEN AND PELVIS WITHOUT CONTRAST  TECHNIQUE: Multidetector CT imaging of the abdomen and pelvis was performed following the standard protocol without IV contrast.  COMPARISON:  Lumbar spine radiographs performed 04/08/2008  FINDINGS: The visualized lung bases are clear. Scattered coronary artery calcifications are seen. Trace pericardial fluid remains within normal limits.  The liver and spleen are unremarkable in appearance. A tiny stone is noted within the gallbladder. The gallbladder is otherwise unremarkable. The pancreas and adrenal glands are unremarkable.  There is no evidence of hydronephrosis. A 3 mm stone is noted at the left side of the base of the bladder. This may reflect an intermittently obstructing or recently passed stone. Nonspecific perinephric stranding is noted bilaterally. A 1.6 cm cyst is noted at the posterior aspect of the right kidney.  No free fluid is identified. The small bowel is unremarkable in appearance. The stomach is within normal limits. No acute  vascular abnormalities are seen. Mild scattered calcification is seen along the abdominal aorta and its branches.  The appendix is normal in caliber and contains air, without evidence for appendicitis. Minimal diverticulosis is noted along the mid to distal sigmoid colon.  The bladder is mildly distended and otherwise unremarkable. The prostate is enlarged, measuring in transverse dimension. No inguinal lymphadenopathy is seen.  No acute osseous abnormalities are identified. Mild facet disease is noted at the lower lumbar spine.  IMPRESSION: 1. 3 mm stone at the left side of the base of the bladder. This may reflect an intermittently obstructing or recently passed stone. No evidence of hydronephrosis at this time. 2. Small right renal cyst seen. 3. Scattered coronary artery calcifications noted. 4. Minimal cholelithiasis; gallbladder otherwise unremarkable. 5. Scattered calcification along the abdominal aorta and its branches. 6. Minimal diverticulosis at the mid to distal sigmoid colon. 7. Enlarged prostate.  Electronically Signed   By: Roanna Raider M.D.   On: 07/09/2014 09:16    MDM  Plan referral Dr.Manny, urologist, he seen in the past Final diagnoses:  None   Prescriptions Percocet, Reglan. He's also advised he can take ibuprofen for mild pain Diagnosis ureteral colic    Doug Sou, MD 07/09/14 [phone]

## 2014-07-09 NOTE — ED Notes (Signed)
Patient c/o sudden onset left flank pain and nausea this AM. Patient denies vomiting or diarrhea. Patient denies any visible blood in his urine.

## 2014-07-09 NOTE — ED Notes (Signed)
Patient is aware we need a urine specimen. 

## 2014-09-27 ENCOUNTER — Telehealth: Payer: Self-pay | Admitting: Gastroenterology

## 2014-09-27 NOTE — Telephone Encounter (Signed)
Spoke with patient and he is having problems with constipation, no bowel movement x 9 days. He has taken Dulcolax with a little result of diarrhea. He is having slight nausea and no appetite. Denies abdominal pain or vomiting. He will try Magnesium Citrate one bottle. If no results he will call back. He will use Miralax daily or every other day to maintain good bowel movement after this. Scheduled patient for f/u OV with Willette ClusterPaula Guenther, NP on 10/04/14 at 2:00 PM to establish with new GI. Patient does not have any preference for MD.

## 2014-09-27 NOTE — Telephone Encounter (Signed)
ok 

## 2014-09-30 ENCOUNTER — Encounter: Payer: Self-pay | Admitting: *Deleted

## 2014-10-04 ENCOUNTER — Encounter: Payer: Self-pay | Admitting: Nurse Practitioner

## 2014-10-04 ENCOUNTER — Ambulatory Visit (INDEPENDENT_AMBULATORY_CARE_PROVIDER_SITE_OTHER): Payer: 59 | Admitting: Nurse Practitioner

## 2014-10-04 VITALS — BP 130/90 | HR 72 | Ht 66.25 in | Wt 183.2 lb

## 2014-10-04 DIAGNOSIS — K5900 Constipation, unspecified: Secondary | ICD-10-CM | POA: Diagnosis not present

## 2014-10-04 NOTE — Patient Instructions (Signed)
I appreciate the opportunity to care for you.  

## 2014-10-05 ENCOUNTER — Encounter: Payer: Self-pay | Admitting: Nurse Practitioner

## 2014-10-05 DIAGNOSIS — K5900 Constipation, unspecified: Secondary | ICD-10-CM | POA: Insufficient documentation

## 2014-10-05 NOTE — Progress Notes (Signed)
     History of Present Illness:  Patient is a 64 year old male, previously known to Dr. Jarold MottoPatterson. He had a screening colonoscopy Dec 2014 with findings of mld diverticulosis and an ncidental AVM. Patient here for evaluation of recent constipation. He rarely gets constipated but recently went 8 days without urge to defecate. No dietary or medication changes. Physically active. Patient took a bottle of Mg Citrate and then spent several hours emptying his bowels. BMs have been back to normal for several days now.   Current Medications, Allergies, Past Medical History, Past Surgical History, Family History and Social History were [facility].  Physical Exam: General: Pleasant, well developed , white male in no acute distress Head: Normocephalic and atraumatic Eyes:  sclerae anicteric, conjunctiva pink  Ears: Normal auditory acuity Lungs: Clear throughout to auscultation Heart: Regular rate and rhythm Abdomen: Soft, non distended, non-tender. No masses, no hepatomegaly. Normal bowel sounds Musculoskeletal: Symmetrical with no gross deformities  Extremities: No edema  Neurological: Alert oriented x 4, grossly nonfocal Psychological:  Alert and cooperative. Normal mood and affect  Assessment and Recommendations:  Pleasant 64 year old male with recent episode of constipation now resolved after Mg+ citrate. He has very occasional constipation. No need to add anything. Continue exercise, adequate water intake, high fiber diet. Call if needed. He is up to date on colon cancer screening.

## 2014-10-06 NOTE — Progress Notes (Signed)
Agree. I met patient personally.

## 2015-03-01 IMAGING — US US SCROTUM
1 series · 13 of 25 positions shown · non-contrast
Comparison: None.

CLINICAL DATA: Left testicular torsion surgery 30 years ago. Left
testicular pain.

EXAM:
SCROTAL ULTRASOUND
DOPPLER ULTRASOUND OF THE TESTICLES
TECHNIQUE: Complete ultrasound examination of the testicles, epididymis, and
other scrotal structures was performed. Color and spectral Doppler
ultrasound were also utilized to evaluate blood flow to the
testicles.

[Series 1: us scrotum · 0.08mm/px · 13 of 69 slices shown]
[im 1/69]
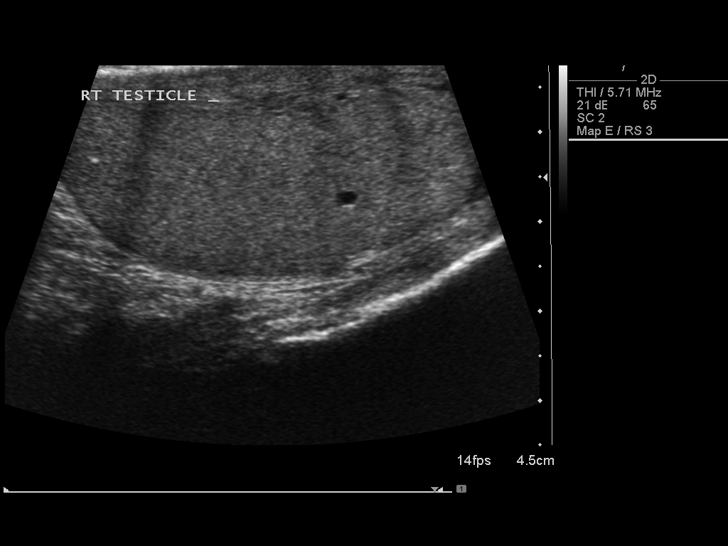
[im 6/69]
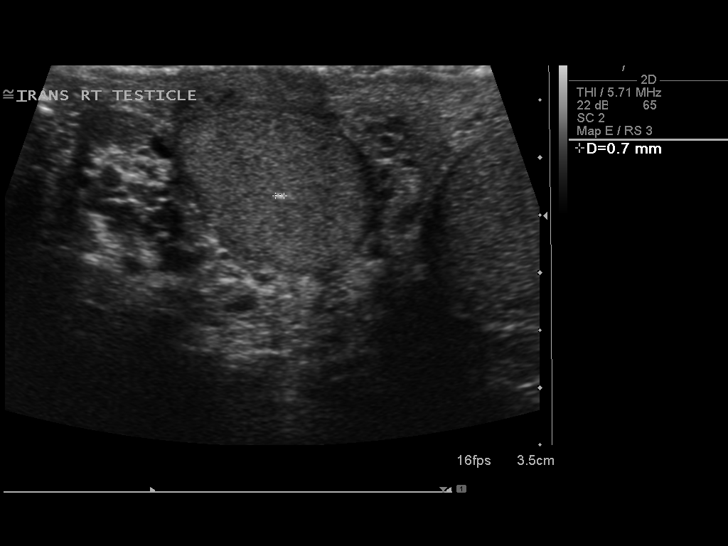
[im 12/69]
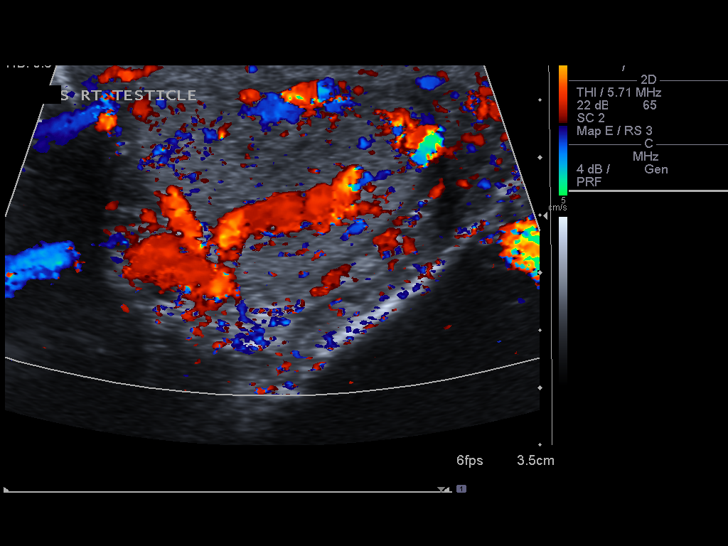
[im 18/69]
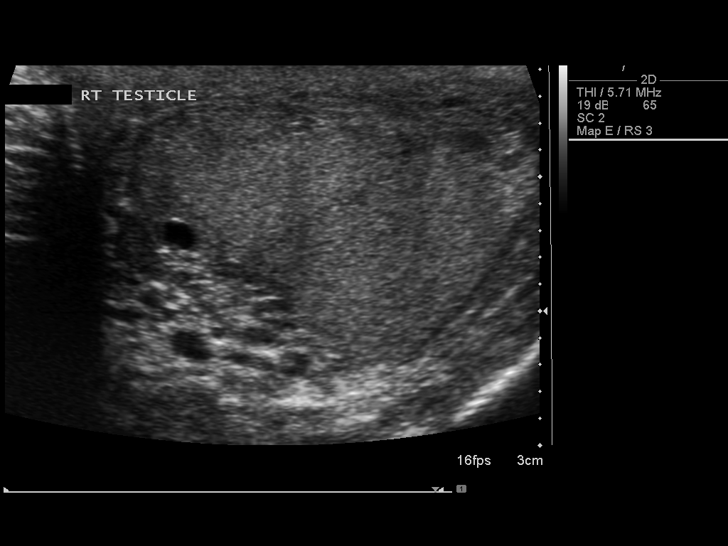
[im 23/69]
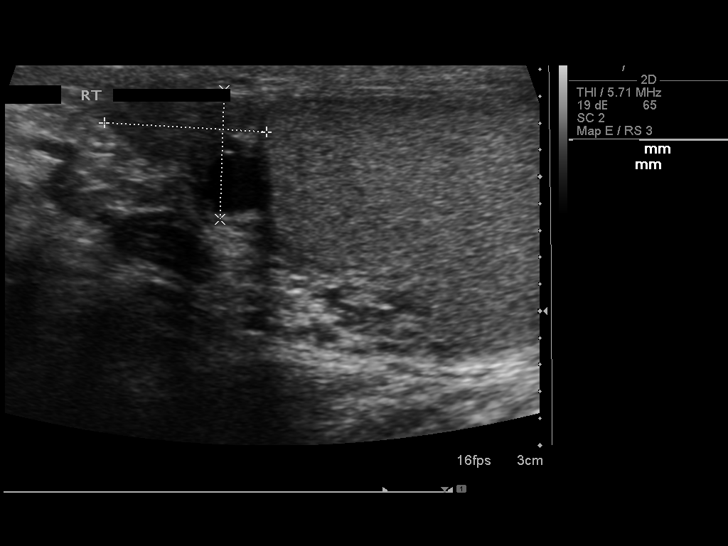
[im 29/69]
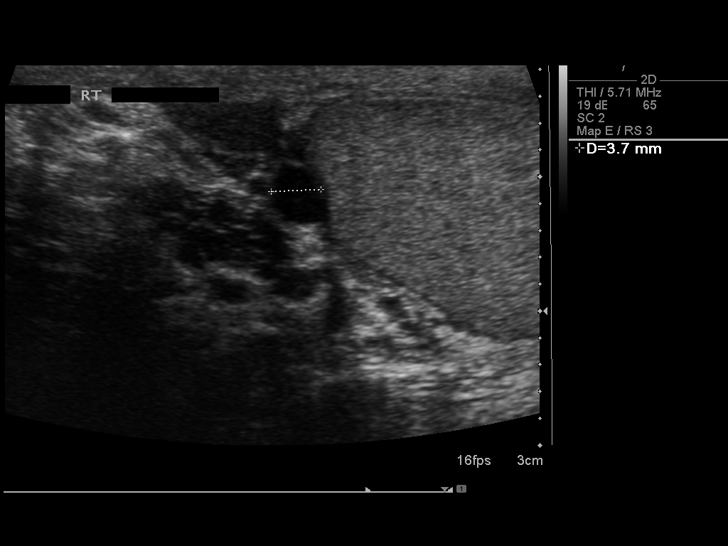
[im 35/69]
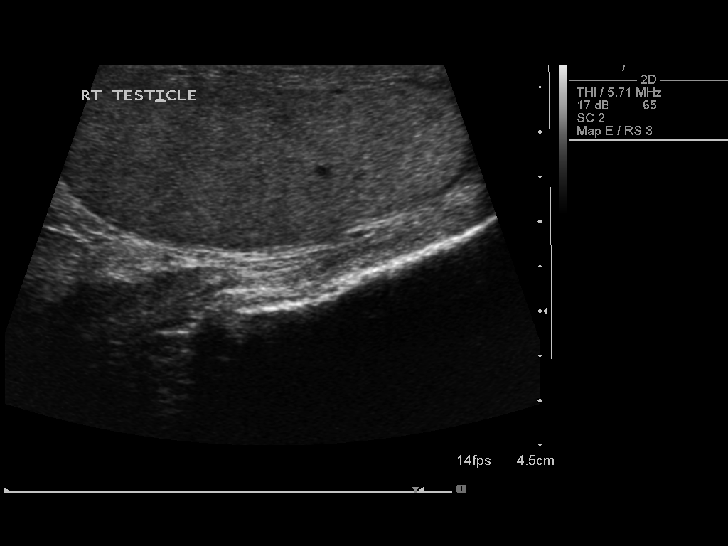
[im 40/69]
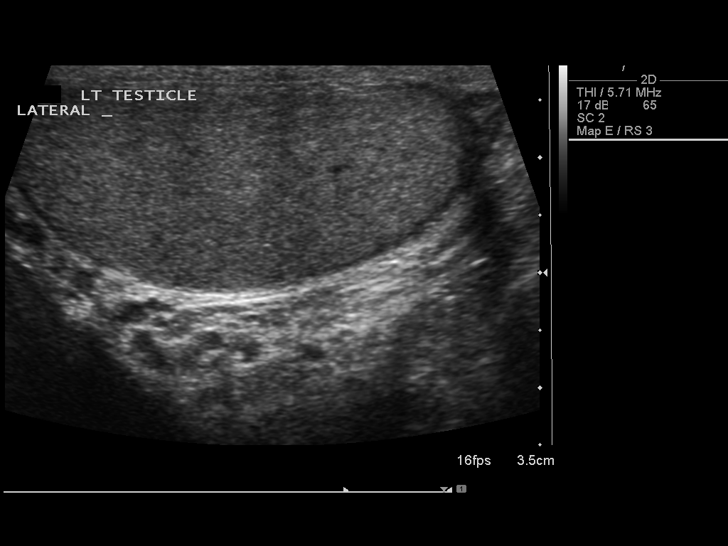
[im 46/69]
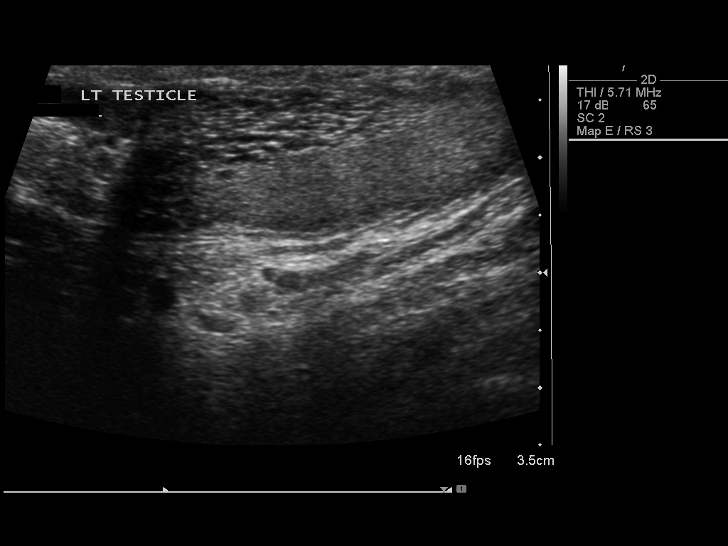
[im 52/69]
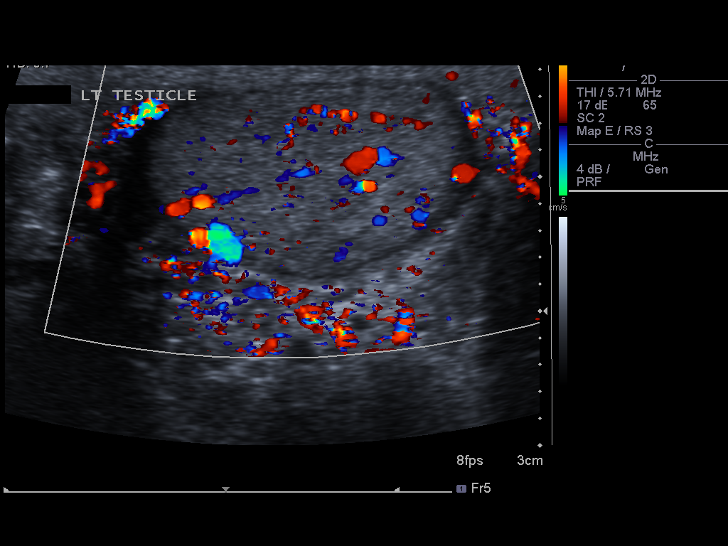
[im 57/69]
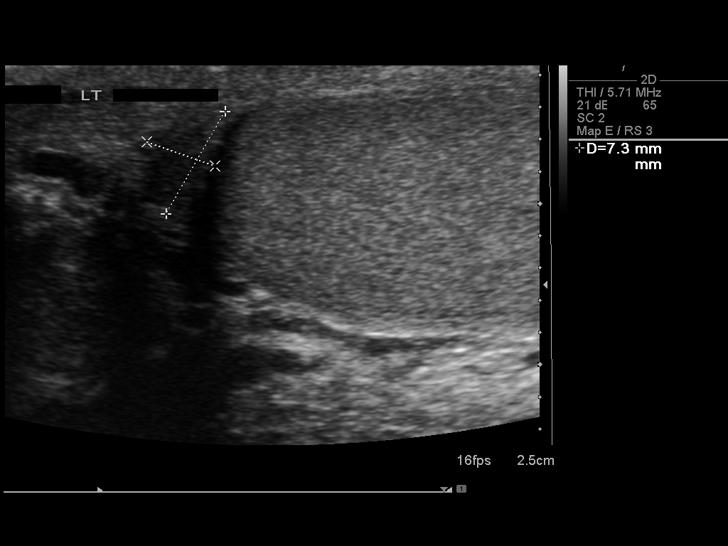
[im 63/69]
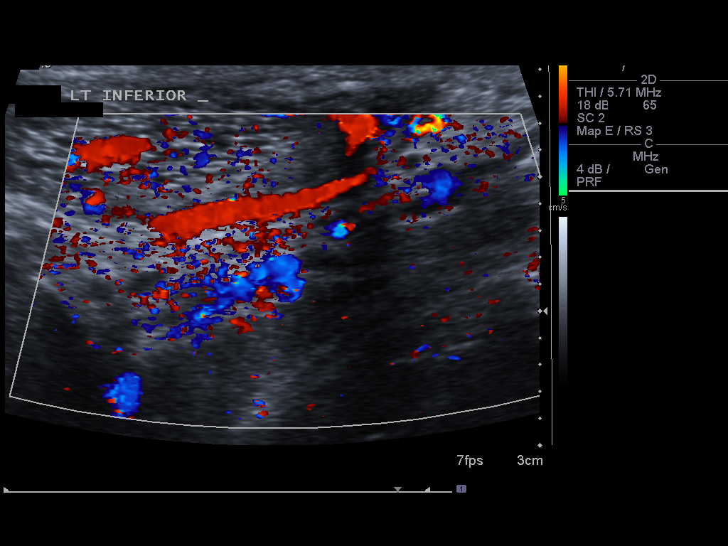
[im 69/69]
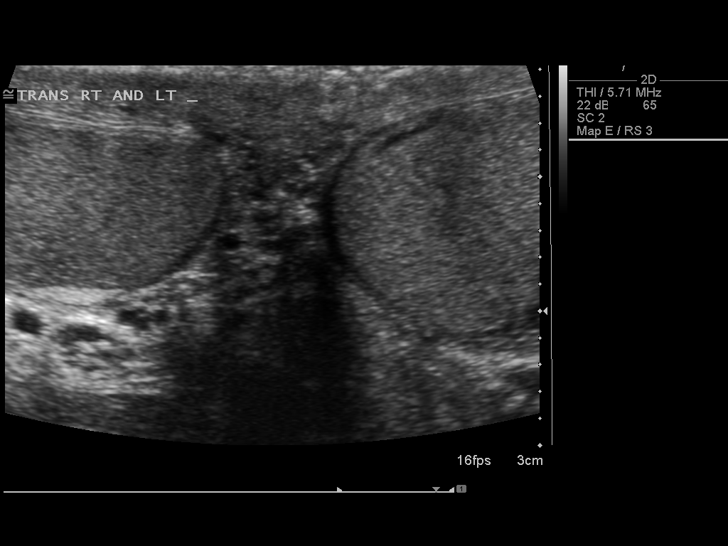

[13 of 25 positions shown; findings below may reference images not displayed]

FINDINGS: Right testicle

Measurements: 4.6 x 2 x 2.4 cm. There is a small anechoic 3 mm
intratesticular lesion most consistent with a small cyst. There is a
small 0.7 mm calcification within the right testicle. No mass
visualized.

Left testicle

Measurements: 4.5 x 1.7 x 3 cm. Incidental note made of a Rete
testes. No mass or microlithiasis visualized.

Right epididymis: There is a 5 mm anechoic right epididymal mass
most consistent with a epididymal cyst.

Left epididymis:  Normal in size and appearance.

Hydrocele:  None visualized.

Varicocele: There is a small left varicocele. There is no right
varicocele.

Pulsed Doppler interrogation of both testes demonstrates low
resistance arterial and venous waveforms bilaterally.
IMPRESSION: 1. No active testicular torsion.
2. Small left varicocele.
3. Small right epididymal cyst.
4. Punctate calcification within the right testicle. No testicular
mass identified.
5. Small right testicular cyst.

## 2019-07-31 ENCOUNTER — Ambulatory Visit: Payer: Self-pay | Attending: Internal Medicine

## 2019-07-31 DIAGNOSIS — Z23 Encounter for immunization: Secondary | ICD-10-CM | POA: Insufficient documentation

## 2019-07-31 NOTE — Progress Notes (Signed)
   Covid-19 Vaccination Clinic  Name:  Chris Tucker    MRN: [id number] DOB: [birth year]  07/31/2019  [name] was observed post Covid-19 immunization for 15 minutes without incidence. He was provided with Vaccine Information Sheet and instruction to access the V-Safe system.   [name] was instructed to call 911 with any severe reactions post vaccine: Marland Kitchen Difficulty breathing  . Swelling of your face and throat  . A fast heartbeat  . A bad rash all over your body  . Dizziness and weakness    Immunizations Administered    Name Date Dose VIS Date Route   Pfizer COVID-19 Vaccine 07/31/2019  1:45 PM 0.3 mL 05/21/2019 Intramuscular   Manufacturer: [employer], [employer]   Lot: [id number]   NDC: [address]-4

## 2019-08-25 ENCOUNTER — Ambulatory Visit: Payer: 59 | Attending: Internal Medicine

## 2019-08-25 DIAGNOSIS — Z23 Encounter for immunization: Secondary | ICD-10-CM

## 2019-08-25 NOTE — Progress Notes (Signed)
   Covid-19 Vaccination Clinic  Name:  Chris Tucker    MRN: [id number] DOB: 1951/03/21  08/25/2019  [name] was observed post Covid-19 immunization for 15 minutes without incident. He was provided with Vaccine Information Sheet and instruction to access the V-Safe system.   [name] was instructed to call 911 with any severe reactions post vaccine: Marland Kitchen Difficulty breathing  . Swelling of face and throat  . A fast heartbeat  . A bad rash all over body  . Dizziness and weakness   Immunizations Administered    Name Date Dose VIS Date Route   Pfizer COVID-19 Vaccine 08/25/2019 10:04 AM 0.3 mL 05/21/2019 Intramuscular   Manufacturer: [employer], [employer]   Lot: [id number]   NDC: [address]-7

## 2021-02-20 ENCOUNTER — Other Ambulatory Visit: Payer: Self-pay | Admitting: Family Medicine

## 2021-02-20 DIAGNOSIS — R4189 Other symptoms and signs involving cognitive functions and awareness: Secondary | ICD-10-CM

## 2021-02-26 ENCOUNTER — Ambulatory Visit
Admission: RE | Admit: 2021-02-26 | Discharge: 2021-02-26 | Disposition: A | Discharge status: Home / Self Care | Payer: 59 | Source: Ambulatory Visit | Attending: Family Medicine | Admitting: Family Medicine

## 2021-02-26 DIAGNOSIS — R4189 Other symptoms and signs involving cognitive functions and awareness: Secondary | ICD-10-CM

## 2021-04-11 ENCOUNTER — Institutional Professional Consult (permissible substitution): Payer: Medicare Other | Admitting: Neurology

## 2021-05-21 ENCOUNTER — Emergency Department (HOSPITAL_BASED_OUTPATIENT_CLINIC_OR_DEPARTMENT_OTHER)
Admission: EM | Admit: 2021-05-21 | Discharge: 2021-05-21 | Disposition: A | Discharge status: Home / Self Care | Payer: Medicare Other | Attending: Emergency Medicine | Admitting: Emergency Medicine

## 2021-05-21 ENCOUNTER — Encounter (HOSPITAL_BASED_OUTPATIENT_CLINIC_OR_DEPARTMENT_OTHER): Payer: Self-pay | Admitting: Emergency Medicine

## 2021-05-21 ENCOUNTER — Other Ambulatory Visit: Payer: Self-pay

## 2021-05-21 DIAGNOSIS — I10 Essential (primary) hypertension: Secondary | ICD-10-CM | POA: Insufficient documentation

## 2021-05-21 DIAGNOSIS — U071 COVID-19: Secondary | ICD-10-CM | POA: Insufficient documentation

## 2021-05-21 DIAGNOSIS — Z87891 Personal history of nicotine dependence: Secondary | ICD-10-CM | POA: Diagnosis not present

## 2021-05-21 DIAGNOSIS — N3001 Acute cystitis with hematuria: Secondary | ICD-10-CM | POA: Diagnosis not present

## 2021-05-21 DIAGNOSIS — Z79899 Other long term (current) drug therapy: Secondary | ICD-10-CM | POA: Diagnosis not present

## 2021-05-21 DIAGNOSIS — R509 Fever, unspecified: Secondary | ICD-10-CM | POA: Diagnosis present

## 2021-05-21 LAB — CBC
HCT: 41 % (ref 39.0–52.0)
Hemoglobin: 14.1 g/dL (ref 13.0–17.0)
MCH: 29.3 pg (ref 26.0–34.0)
MCHC: 34.4 g/dL (ref 30.0–36.0)
MCV: 85.1 fL (ref 80.0–100.0)
Platelets: 194 10*3/uL (ref 150–400)
RBC: 4.82 MIL/uL (ref 4.22–5.81)
RDW: 12.2 % (ref 11.5–15.5)
WBC: 17 10*3/uL — ABNORMAL HIGH (ref 4.0–10.5)
nRBC: 0 % (ref 0.0–0.2)

## 2021-05-21 LAB — COMPREHENSIVE METABOLIC PANEL
ALT: 20 U/L (ref 0–44)
AST: 21 U/L (ref 15–41)
Albumin: 3.9 g/dL (ref 3.5–5.0)
Alkaline Phosphatase: 58 U/L (ref 38–126)
Anion gap: 11 (ref 5–15)
BUN: 16 mg/dL (ref 8–23)
CO2: 25 mmol/L (ref 22–32)
Calcium: 8.6 mg/dL — ABNORMAL LOW (ref 8.9–10.3)
Chloride: 99 mmol/L (ref 98–111)
Creatinine, Ser: 1.23 mg/dL (ref 0.61–1.24)
GFR, Estimated: >60 mL/min (ref >60)
Glucose, Bld: 110 mg/dL — ABNORMAL HIGH (ref 70–99)
Potassium: 3.6 mmol/L (ref 3.5–5.1)
Sodium: 135 mmol/L (ref 135–145)
Total Bilirubin: 1.5 mg/dL — ABNORMAL HIGH (ref 0.3–1.2)
Total Protein: 6.5 g/dL (ref 6.5–8.1)

## 2021-05-21 LAB — URINALYSIS, ROUTINE W REFLEX MICROSCOPIC
Bilirubin Urine: NEGATIVE
Glucose, UA: NEGATIVE mg/dL
Ketones, ur: 15 mg/dL — AB
Nitrite: NEGATIVE
Protein, ur: 100 mg/dL — AB
Specific Gravity, Urine: 1.026 (ref 1.005–1.030)
WBC, UA: >50 WBC/hpf — ABNORMAL HIGH (ref 0–5)
pH: 6 (ref 5.0–8.0)

## 2021-05-21 LAB — RESP PANEL BY RT-PCR (FLU A&B, COVID) ARPGX2
Influenza A by PCR: NEGATIVE
Influenza B by PCR: NEGATIVE
SARS Coronavirus 2 by RT PCR: POSITIVE — AB

## 2021-05-21 LAB — LIPASE, BLOOD: Lipase: <10 U/L — ABNORMAL LOW (ref 11–51)

## 2021-05-21 MED ORDER — SODIUM CHLORIDE 0.9 % IV BOLUS
1000.0000 mL | Freq: Once | INTRAVENOUS | Status: AC
  Administered 2021-05-21: 1000 mL via INTRAVENOUS

## 2021-05-21 MED ORDER — CIPROFLOXACIN HCL 500 MG PO TABS: 500.0000 mg | ORAL_TABLET | Freq: Two times a day (BID) | ORAL | 0 refills | Status: AC

## 2021-05-21 MED ORDER — ACETAMINOPHEN 500 MG PO TABS
1000.0000 mg | ORAL_TABLET | Freq: Once | ORAL | Status: AC
  Administered 2021-05-21: 1000 mg via ORAL
  Filled 2021-05-21: qty 2

## 2021-05-21 MED ORDER — CEFTRIAXONE SODIUM 1 G IJ SOLR
1.0000 g | Freq: Once | INTRAMUSCULAR | Status: AC
  Administered 2021-05-21: 1 g via INTRAVENOUS
  Filled 2021-05-21: qty 10

## 2021-05-21 NOTE — ED Provider Notes (Signed)
[facility] DEPT Provider Note   CSN: [id number] Arrival date & time: 05/21/21  1257     History Chief Complaint  Patient presents with   Abdominal Pain    Chris Tucker is a 70 y.o. male. With past medical history of HTN, HLD who presents to the emergency department with chills.  Patient states that last week both him and his wife were ill with sinus infections. He states he initially improved by the end of the week, and then became ill again Saturday. He states that he began having chills, fever to 100.2, and "pain throughout my body." He states that he has ongoing prostate enlargement and intermittent difficulty urinating. He states he usually takes Tamsulosin, which relieves his symptoms within 8-10 hours and he does not have recurrence of symptoms for a month. He states over the past 3 days has has increasingly concentrated urine and dysuria. He states his stream has weakened even after Tamsulosin use. He denies abdominal pain, nausea, vomiting, diarrhea. Denies CP or SOB. Denies gross hematuria, testicular pain or swelling, or penile swelling.    Abdominal Pain Associated symptoms: chills, dysuria, fatigue and fever   Associated symptoms: no chest pain, no diarrhea, no hematuria, no nausea, no shortness of breath and no vomiting       Past Medical History:  Diagnosis Date   AVM (arteriovenous malformation)    of cecum   Bladder stone    Calcification of abdominal aorta (HCC)    Cholelithiasis    Diverticulosis    Hyperlipemia    Hypertension    Renal cyst    Shingles     Patient Active Problem List   Diagnosis Date Noted   CN (constipation) 10/05/2014    Past Surgical History:  Procedure Laterality Date   KNEE SURGERY  2005   testicular torsion  1984   TORSION APPENDIX EXCISION OF INFRACTED EPIPLOIC     WISDOM TOOTH EXTRACTION         Family History  Problem Relation Age of Onset   Bone cancer Father    Ovarian cancer  Mother     Social History   Tobacco Use   Smoking status: Former   Smokeless tobacco: Never  Substance Use Topics   Alcohol use: Yes    Comment: occasionally   Drug use: No    Home Medications Prior to Admission medications   Medication Sig Start Date End Date Taking? Authorizing Provider  metoprolol succinate (TOPROL-XL) 25 MG 24 hr tablet Take 25 mg by mouth daily.    [provider]  Multiple Vitamins-Minerals (MULTI FOR HIM 50+) TABS Take 1 tablet by mouth daily.    [provider]  OMEGA 3 1000 MG CAPS Take 1,000 mg by mouth daily.     [provider]  Red Yeast Rice 600 MG CAPS Take 600 mg by mouth daily.     [provider]    Allergies    Linzess [linaclotide], Morphine and related, and Statins  Review of Systems   Review of Systems  Constitutional:  Positive for chills, fatigue and fever.  Respiratory:  Negative for shortness of breath.   Cardiovascular:  Negative for chest pain.  Gastrointestinal:  Positive for abdominal pain. Negative for diarrhea, nausea and vomiting.  Genitourinary:  Positive for decreased urine volume, difficulty urinating and dysuria. Negative for flank pain, hematuria, penile discharge, penile pain, penile swelling, scrotal swelling and testicular pain.  All other systems reviewed and are negative.  Physical Exam  Updated Vital Signs BP 128/71   Pulse 78   Temp 98.5 F (36.9 C) (Oral)   Resp 18   Ht 5\' 6"  (1.676 m)   Wt 78 kg   SpO2 99%   BMI 27.76 kg/m   Physical Exam Vitals and nursing note reviewed.  Constitutional:      General: He is not in acute distress.    Appearance: Normal appearance. He is well-developed. He is ill-appearing. He is not toxic-appearing.  HENT:     Head: Normocephalic and atraumatic.     Nose: Congestion present.     Mouth/Throat:     Mouth: Mucous membranes are moist.     Pharynx: Oropharynx is clear.  Eyes:     General: No scleral icterus. Cardiovascular:      Rate and Rhythm: Normal rate and regular rhythm.     Pulses: Normal pulses.     Heart sounds: Normal heart sounds. No murmur heard. Pulmonary:     Effort: Pulmonary effort is normal. No respiratory distress.     Breath sounds: Normal breath sounds.  Abdominal:     General: Abdomen is protuberant. Bowel sounds are normal. There is no distension.     Palpations: Abdomen is soft.     Tenderness: There is abdominal tenderness in the suprapubic area. There is no right CVA tenderness, left CVA tenderness, guarding or rebound. Negative signs include Murphy's sign.  Musculoskeletal:        General: Normal range of motion.     Cervical back: Neck supple.  Skin:    General: Skin is warm and dry.     Capillary Refill: Capillary refill takes less than 2 seconds.  Neurological:     General: No focal deficit present.     Mental Status: He is alert and oriented to person, place, and time. Mental status is at baseline.  Psychiatric:        Mood and Affect: Mood normal.        Behavior: Behavior normal.    ED Results / Procedures / Treatments   Labs (all labs ordered are listed, but only abnormal results are displayed) Labs Reviewed  RESP PANEL BY RT-PCR (FLU A&B, COVID) ARPGX2 - Abnormal; Notable for the following components:      Result Value   SARS Coronavirus 2 by RT PCR POSITIVE (*)    All other components within normal limits  LIPASE, BLOOD - Abnormal; Notable for the following components:   Lipase <10 (*)    All other components within normal limits  COMPREHENSIVE METABOLIC PANEL - Abnormal; Notable for the following components:   Glucose, Bld 110 (*)    Calcium 8.6 (*)    Total Bilirubin 1.5 (*)    All other components within normal limits  CBC - Abnormal; Notable for the following components:   WBC 17.0 (*)    All other components within normal limits  URINALYSIS, ROUTINE W REFLEX MICROSCOPIC - Abnormal; Notable for the following components:   APPearance CLOUDY (*)    Hgb urine  dipstick MODERATE (*)    Ketones, ur 15 (*)    Protein, ur 100 (*)    Leukocytes,Ua LARGE (*)    WBC, UA >50 (*)    Bacteria, UA MANY (*)    Non Squamous Epithelial 0-5 (*)    All other components within normal limits  CULTURE, BLOOD (ROUTINE X 2)  CULTURE, BLOOD (ROUTINE X 2)  URINE CULTURE   EKG None  Radiology No results found.  Procedures  Procedures   Medications Ordered in ED Medications  sodium chloride 0.9 % bolus 1,000 mL (0 mLs Intravenous Stopped 05/21/21 1647)  cefTRIAXone (ROCEPHIN) 1 g in sodium chloride 0.9 % 100 mL IVPB (0 g Intravenous Stopped 05/21/21 1619)  acetaminophen (TYLENOL) tablet 1,000 mg (1,000 mg Oral Given 05/21/21 1548)   ED Course  I have reviewed the triage vital signs and the nursing notes.  Pertinent labs & imaging results that were available during my care of the patient were reviewed by me and considered in my medical decision making (see chart for details).    MDM Rules/Calculators/A&P 70 year old male who presents to the emergency department with URI symptoms and abdominal pain.   Regarding URI symptoms: COVID-19 positive  Urinary symptoms Initially febrile, defervesced with Tylenol 1g.  WBC 17, UA with evidence of UTI  No CVA tenderness or flank pain, diffuse abdominal pain. Doubt pyelonephritis.  Cr 1.23, similar to previous although remote value.  Bladder scanned patient with no residual, not retaining urine  Given 1L IVF and initial dose of antibiotics in emergency department. He feel significantly improved after treatment.  - urine and blood cultures sent   Patient feels improved since presentation. He feels able to go home with PO antibiotics. He is ill-appearing but non-toxic in appearance. Low suspicion for an acute abdomen, nephrolithiasis, pyelonephritis, or other emergent causes of abdominal pain at this time. He is instructed to continue ibuprofen/tylenol for fever and symptomatic relief of body aches.  Will provide  Ciprofloxacin 500mg  BID for 10 days. Instructed to complete fully. Instructed to return to the emergency department for increasing abdominal pain, fever unresponsive to antipyretics, inability to tolerate fluids or decreased urinary output.  He verbalizes understanding of d/c instructions.  Safe for discharge.  Final Clinical Impression(s) / ED Diagnoses Final diagnoses:  Acute cystitis with hematuria  COVID-19    Rx / DC Orders ED Discharge Orders          Ordered    ciprofloxacin (CIPRO) 500 MG tablet  2 times daily        05/21/21 1735             Mickie Hillier, PA-C 05/22/21 0935    Lennice Sites, DO 05/22/21 1451

## 2021-05-21 NOTE — Discharge Instructions (Addendum)
You are seen in the emergency department today for urinary symptoms.  You do have a UTI.  You also have COVID-19.  I am sending you home on 10 days of an antibiotic.  Please take this twice a day until completion.  Please return to the emergency department if you begin having shortness of breath, fevers that are unresponsive to Tylenol or Motrin, continued urinary symptoms despite antibiotics, began having increasing abdominal pain or back pain with nausea and vomiting.

## 2021-05-21 NOTE — ED Notes (Signed)
ED Provider at bedside. 

## 2021-05-21 NOTE — ED Triage Notes (Signed)
Whole family sick  with flu last week and so was he  they got better he did  now he has fever  but now he is  feeling poorly , may have a UTI  has had prostate issues in the past , feels week

## 2021-05-23 LAB — URINE CULTURE: Culture: >=100000 — AB

## 2021-05-24 ENCOUNTER — Telehealth: Payer: Self-pay | Admitting: *Deleted

## 2021-05-24 NOTE — Telephone Encounter (Signed)
Post ED Visit - Positive Culture Follow-up  Culture report reviewed by antimicrobial stewardship pharmacist: [facility] []  , Pharm.D. []  Enzo Bi, .D., BCPS AQ-ID []  Celedonio Miyamoto, Pharm.D., BCPS []  [address], Pharm.D., BCPS []  [place], Garvin Fila.D., BCPS, AAHIVP []  , Pharm.D., BCPS, AAHIVP []  Georgina Pillion, PharmD, BCPS []  , PharmD, BCPS []  [place], PharmD, BCPS []  [address], PharmD []  , PharmD, BCPS []  Estella Husk, PharmD  Pharmacy Team []  Lysle Pearl, PharmD []  , PharmD []  Phillips Climes, PharmD []  , Rph []  Agapito Games) , PharmD []  Verlan Friends, PharmD []  , PharmD []  Mervyn Gay, PharmD []  , PharmD []  Vinnie Level, PharmD []  [facility], PharmD []  , PharmD []  Len Childs, PharmD   Positive urine culture Treated with Ciprofloxacin HCL, organism sensitive to the same and no further patient follow-up is required at this time.  [place], Kushnir [facility] 05/24/2021, 10:11 AM

## 2021-05-26 LAB — CULTURE, BLOOD (ROUTINE X 2)
Culture: NO GROWTH
Culture: NO GROWTH
Special Requests: ADEQUATE

## 2021-08-20 ENCOUNTER — Other Ambulatory Visit: Payer: Self-pay | Admitting: Registered Nurse

## 2021-08-20 DIAGNOSIS — E785 Hyperlipidemia, unspecified: Secondary | ICD-10-CM

## 2021-09-12 ENCOUNTER — Ambulatory Visit
Admission: RE | Admit: 2021-09-12 | Discharge: 2021-09-12 | Disposition: A | Discharge status: Home / Self Care | Payer: No Typology Code available for payment source | Source: Ambulatory Visit | Attending: Registered Nurse | Admitting: Registered Nurse

## 2021-09-12 DIAGNOSIS — E785 Hyperlipidemia, unspecified: Secondary | ICD-10-CM

## 2021-10-04 ENCOUNTER — Encounter: Payer: Self-pay | Admitting: Cardiovascular Disease

## 2021-10-04 ENCOUNTER — Ambulatory Visit (INDEPENDENT_AMBULATORY_CARE_PROVIDER_SITE_OTHER): Payer: Medicare Other | Admitting: Cardiovascular Disease

## 2021-10-04 VITALS — BP 166/106 | HR 76 | Ht 67.0 in | Wt 174.6 lb

## 2021-10-04 DIAGNOSIS — I10 Essential (primary) hypertension: Secondary | ICD-10-CM

## 2021-10-04 DIAGNOSIS — E782 Mixed hyperlipidemia: Secondary | ICD-10-CM

## 2021-10-04 DIAGNOSIS — R931 Abnormal findings on diagnostic imaging of heart and coronary circulation: Secondary | ICD-10-CM

## 2021-10-04 DIAGNOSIS — I2510 Atherosclerotic heart disease of native coronary artery without angina pectoris: Secondary | ICD-10-CM

## 2021-10-04 DIAGNOSIS — E785 Hyperlipidemia, unspecified: Secondary | ICD-10-CM | POA: Insufficient documentation

## 2021-10-04 NOTE — Assessment & Plan Note (Signed)
Chris Tucker had a coronary calcium score performed 09/12/2021 which was 1089 with calcium distributed throughout all 3 coronary arteries and the left main.  He is active and completely asymptomatic.  He does have a right bundle branch block.  I am going to get a Lexiscan Myoview stress test to evaluate for physiologic significance. ?

## 2021-10-04 NOTE — Assessment & Plan Note (Signed)
History of essential hypertension on amlodipine with blood pressure measured today at 166/106.  He says he does have "whitecoat hypertension" and when he checks his blood pressure at home is usually in the 130/70 range. ?

## 2021-10-04 NOTE — Assessment & Plan Note (Signed)
History of hyperlipidemia intolerant to statin therapy on red yeast rice with lipid profile performed 02/08/2021 revealing total cholesterol 221, LDL of 164 and HDL of 47.  Because of his elevated coronary calcium score over thousand I prefer his LDL be less than 100.  We will explore beginning a PCSK9 such as Repatha. ?

## 2021-10-04 NOTE — Progress Notes (Signed)
? ? ? ?10/04/2021 ?Chris Tucker   ?[birth year]  ?[id number] ? ?Primary Physician Fatima Sanger, FNP ?Primary Cardiologist: Runell Gess MD Nicholes Calamity, [place] ? ?HPI:  Chris Tucker is a 71 y.o. thin and fit appearing married Caucasian male father of 1 child who is retired from working in EMS for 30 years.  He retired 20 years ago.  He was referred by narrate prepost FNP because of an abnormal coronary calcium score.  His cardiovascular risk factor profile is notable for treated hypertension, untreated hyperlipidemia because of statin intolerance.  There is no family history for heart disease.  Is never had a heart attack or stroke.  He denies chest pain or shortness of breath.  He is fairly active working on his land splitting wood, cutting down trees and doing yard work.  He had a coronary calcium score performed/5/23 which was 1089 with calcium distributed throughout his coronary tree. ? ? ?Current Meds  ?Medication Sig  ? amLODipine (NORVASC) 10 MG tablet Take 10 mg by mouth daily.  ? Ibuprofen (ADVIL) 200 MG CAPS Take by mouth as needed.  ? Multiple Vitamins-Minerals (MULTI FOR HIM 50+) TABS Take 1 tablet by mouth daily.  ? OMEGA 3 1000 MG CAPS Take 1,000 mg by mouth daily.   ? Red Yeast Rice 600 MG CAPS Take 600 mg by mouth daily.   ?  ? ?Allergies  ?Allergen Reactions  ? Linzess [Linaclotide] Other (See Comments)  ?  Pt. States that linzess caused considerable dizziness, and inabillity to drive.  ? Morphine And Related Nausea And Vomiting  ? Olmesartan   ?  Other reaction(s): constipation  ? Statins   ?  Flulike symptoms  ? ? ?Social History  ? ?Socioeconomic History  ? Marital status: Married  ?  Spouse name: Not on file  ? Number of children: 1  ? Years of education: Not on file  ? Highest education level: Not on file  ?Occupational History  ? Occupation: Retired  ?Tobacco Use  ? Smoking status: Former  ? Smokeless tobacco: Never  ?Substance and Sexual Activity  ? Alcohol use: Yes  ?  Comment:  occasionally  ? Drug use: No  ? Sexual activity: Not on file  ?Other Topics Concern  ? Not on file  ?Social History Narrative  ? Not on file  ? ?Social Determinants of Health  ? ?Financial Resource Strain: Not on file  ?Food Insecurity: Not on file  ?Transportation Needs: Not on file  ?Physical Activity: Not on file  ?Stress: Not on file  ?Social Connections: Not on file  ?Intimate Partner Violence: Not on file  ?  ? ?Review of Systems: ?General: negative for chills, fever, night sweats or weight changes.  ?Cardiovascular: negative for chest pain, dyspnea on exertion, edema, orthopnea, palpitations, paroxysmal nocturnal dyspnea or shortness of breath ?Dermatological: negative for rash ?Respiratory: negative for cough or wheezing ?Urologic: negative for hematuria ?Abdominal: negative for nausea, vomiting, diarrhea, bright red blood per rectum, melena, or hematemesis ?Neurologic: negative for visual changes, syncope, or dizziness ?All other systems reviewed and are otherwise negative except as noted above. ? ? ? ?Blood pressure (!) 166/106, pulse 76, height 5\' 7"  (1.702 m), weight 174 lb 9.6 oz (79.2 kg), SpO2 98 %.  ?General appearance: alert and no distress ?Neck: no adenopathy, no carotid bruit, no JVD, supple, symmetrical, trachea midline, and thyroid not enlarged, symmetric, no tenderness/mass/nodules ?Lungs: clear to auscultation bilaterally ?Heart: regular rate and rhythm, S1, S2 normal, no murmur,  click, rub or gallop ?Extremities: extremities normal, atraumatic, no cyanosis or edema ?Pulses: 2+ and symmetric ?Skin: Skin color, texture, turgor normal. No rashes or lesions ?Neurologic: Grossly normal ? ?EKG sinus rhythm at 76 with right bundle branch block and left anterior fascicular block (bifascicular block).  I personally reviewed this EKG. ? ?ASSESSMENT AND PLAN:  ? ?Essential hypertension ?History of essential hypertension on amlodipine with blood pressure measured today at 166/106.  He says he does have  "whitecoat hypertension" and when he checks his blood pressure at home is usually in the 130/70 range. ? ?Hyperlipidemia ?History of hyperlipidemia intolerant to statin therapy on red yeast rice with lipid profile performed 02/08/2021 revealing total cholesterol 221, LDL of 164 and HDL of 47.  Because of his elevated coronary calcium score over thousand I prefer his LDL be less than 100.  We will explore beginning a PCSK9 such as Repatha. ? ?Elevated coronary artery calcium score ?Mr. Schara had a coronary calcium score performed 09/12/2021 which was 1089 with calcium distributed throughout all 3 coronary arteries and the left main.  He is active and completely asymptomatic.  He does have a right bundle branch block.  I am going to get a Lexiscan Myoview stress test to evaluate for physiologic significance. ? ? ? ? ?Runell Gess MD FACP,FACC,FAHA, FSCAI ?10/04/2021 ?11:07 AM ?

## 2021-10-04 NOTE — Patient Instructions (Addendum)
Medication Instructions:  ?Your physician recommends that you continue on your current medications as directed. Please refer to the Current Medication list given to you today.  ?*If you need a refill on your cardiac medications before your next appointment, please call your pharmacy* ? ? ?Lab Work: ?None ?If you have labs (blood work) drawn today and your tests are completely normal, you will receive your results only by: ?MyChart Message (if you have MyChart) OR ?A paper copy in the mail ?If you have any lab test that is abnormal or we need to change your treatment, we will call you to review the results. ? ? ?Testing/Procedures: ?Your physician has requested that you have a lexiscan myoview. For further information please visit [email or web address] Please follow instruction sheet, as given. ? ? ?The test will take approximately 3 to 4 hours to complete; you may bring reading material.  If someone comes with you to your appointment, they will need to remain in the main lobby due to limited space in the testing area. **If you are pregnant or breastfeeding, please notify the nuclear lab prior to your appointment** ? ?How to prepare for your Myocardial Perfusion Test: ?Do not eat or drink 3 hours prior to your test, except you may have water. ?Do not consume products containing caffeine (regular or decaffeinated) 12 hours prior to your test. (ex: coffee, chocolate, sodas, tea). ?Do bring a list of your current medications with you.  If not listed below, you may take your medications as normal. ?Do wear comfortable clothes (no dresses or overalls) and walking shoes, tennis shoes preferred (No heels or open toe shoes are allowed). ?Do NOT wear cologne, perfume, aftershave, or lotions (deodorant is allowed). ?If these instructions are not followed, your test will have to be rescheduled. ? ? ? ? ?Follow-Up: ?At [facility], you and your health needs are our priority.  As part of our continuing mission to provide you  with exceptional heart care, we have created designated Provider Care Teams.  These Care Teams include your primary Cardiologist (physician) and Advanced Practice Providers (APPs -  Physician Assistants and Nurse Practitioners) who all work together to provide you with the care you need, when you need it. ? ?We recommend signing up for the patient portal called "MyChart".  Sign up information is provided on this After Visit Summary.  MyChart is used to connect with patients for Virtual Visits (Telemedicine).  Patients are able to view lab/test results, encounter notes, upcoming appointments, etc.  Non-urgent messages can be sent to your provider as well.   ?To learn more about what you can do with MyChart, go to [email or web address].   ? ?Your next appointment:   ?1 year(s) ? ?The format for your next appointment:   ?In Person ? ?Provider:   ?Nanetta Batty, MD   ? ? ?Other Instructions ? ? ?Important Information About Sugar ? ? ? ? ?  ?

## 2021-10-05 ENCOUNTER — Telehealth (HOSPITAL_COMMUNITY): Payer: Self-pay | Admitting: *Deleted

## 2021-10-05 NOTE — Telephone Encounter (Signed)
Close encounter 

## 2021-10-09 ENCOUNTER — Ambulatory Visit (HOSPITAL_COMMUNITY)
Admission: RE | Admit: 2021-10-09 | Discharge: 2021-10-09 | Disposition: A | Discharge status: Home / Self Care | Payer: Medicare Other | Source: Ambulatory Visit | Attending: Internal Medicine | Admitting: Internal Medicine

## 2021-10-09 ENCOUNTER — Ambulatory Visit (INDEPENDENT_AMBULATORY_CARE_PROVIDER_SITE_OTHER): Payer: Medicare Other | Admitting: Pharmacist Clinician (PhC)/ Clinical Pharmacy Specialist

## 2021-10-09 ENCOUNTER — Telehealth: Payer: Self-pay

## 2021-10-09 DIAGNOSIS — E782 Mixed hyperlipidemia: Secondary | ICD-10-CM | POA: Diagnosis not present

## 2021-10-09 DIAGNOSIS — I2510 Atherosclerotic heart disease of native coronary artery without angina pectoris: Secondary | ICD-10-CM | POA: Diagnosis not present

## 2021-10-09 LAB — MYOCARDIAL PERFUSION IMAGING
LV dias vol: 116 mL (ref 62–150)
LV sys vol: 59 mL
Nuc Stress EF: 49 %
Peak HR: 100 {beats}/min
Rest HR: 63 {beats}/min
Rest Nuclear Isotope Dose: 9.8 mCi
SDS: 1
SRS: 6
SSS: 7
ST Depression (mm): 0 mm
Stress Nuclear Isotope Dose: 32 mCi
TID: 1.01

## 2021-10-09 MED ORDER — REGADENOSON 0.4 MG/5ML IV SOLN
0.4000 mg | Freq: Once | INTRAVENOUS | Status: AC
  Administered 2021-10-09: 0.4 mg via INTRAVENOUS

## 2021-10-09 MED ORDER — TECHNETIUM TC 99M TETROFOSMIN IV KIT
9.8000 | PACK | Freq: Once | INTRAVENOUS | Status: AC | PRN
  Administered 2021-10-09: 9.8 via INTRAVENOUS
  Filled 2021-10-09: qty 10

## 2021-10-09 MED ORDER — AMINOPHYLLINE 25 MG/ML IV SOLN
75.0000 mg | Freq: Once | INTRAVENOUS | Status: AC
  Administered 2021-10-09: 75 mg via INTRAVENOUS

## 2021-10-09 MED ORDER — TECHNETIUM TC 99M TETROFOSMIN IV KIT
32.0000 | PACK | Freq: Once | INTRAVENOUS | Status: AC | PRN
  Administered 2021-10-09: 32 via INTRAVENOUS
  Filled 2021-10-09: qty 32

## 2021-10-09 NOTE — Patient Instructions (Addendum)
Your Results: ?           ? Your most recent labs Goal  ?Total Cholesterol We will get < 200  ?Triglycerides These results < 150  ?HDL (happy/good cholesterol) From your  > 40  ?LDL (lousy/bad cholesterol PCP Office < 100 (or even < 70)  ? ?Medication changes: ? We will start the process to get Repatha covered by your insurance.  Once the prior authorization is complete, [facility] will call you to let you know and confirm pharmacy information.   You will take one dose every 14 days.   ? ?Lab orders: ? We want to repeat labs after 2-3 months.  We will send you a lab order to remind you once we get closer to that time.   ? ?Patient Assistance:  The [employer] foundation offers assistance to help pay for medication copays.  They will cover copays for all cholesterol lowering meds, including statins, fibrates, omega-3 oils, ezetimibe, Repatha, Praluent, Nexletol, Nexlizet.  The cards are usually good for $2,500 or 12 months, whichever comes first. ?Go to healthwellfoundation.org ?Click on ?Apply Now? ?Answer questions as to whom is applying (patient or representative) ?Your disease fund will be ?hypercholesterolemia - Medicare access? ?They will ask questions about finances and which medications you are taking for cholesterol ?When you submit, the approval is usually within minutes.  You will need to print the card information from the site ?You will need to show this information to your pharmacy, they will bill your Medicare Part D plan first -then bill [employer] --for the copay.   ?You can also call them at [phone], although the hold times can be quite long.  ? ?Thank you for choosing [facility]  ? ?

## 2021-10-09 NOTE — Assessment & Plan Note (Addendum)
Patient with CAD by calcium score and LDL not at goal.  Reviewed options for lowering LDL cholesterol, including ezetimibe, PCSK-9 inhibitors, bempedoic acid and inclisiran.  Discussed mechanisms of action, dosing, side effects and potential decreases in LDL cholesterol.  Also reviewed cost information and potential options for patient assistance.  Answered all patient questions.  Based on this information, patient would prefer to start Repatha 140 mg every 2 weeks.  Will repeat cholesterol labs after 2-3 months.   ? ?

## 2021-10-09 NOTE — Progress Notes (Signed)
10/11/2021 ?Chris Tucker ?May 13, 1951 ?[id number] ? ? ?HPI:  Chris Tucker is a 71 y.o. male patient of Dr Gwenlyn Found, who presents today for a lipid clinic evaluation.  See pertinent past medical history below.   He was referred to cardiology secondary to elevated coronary calcium score.  Patient notes being intolerant of multiple statins (see below), and despite eating a mostly healthy diet and staying active, has an elevated LDL cholesterol.  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   ? ?Past Medical History: ?CAD Coronary calcium score 1089 (85th percentile)  ?hypertension White coat vs essential - on amlodipine 10 mg  ? ?Current Medications: none ? ?Cholesterol Goals:  LDL < 100 ?  ?Intolerant/previously tried: Lipitor, Crestor caused flu-like feelings, aches ? ?Family history: father had hypertensionas did older brother; both died from cancer, mother no heart issues; no biological children ? ?Diet: mostly home cooked, occasionally eats out; fresh veggies as season allows; mix of meats; snacks on peanut M&M's ? ?Exercise:  works property (couple of acres of land), wood splitting,  ? ?Labs: 08/09/21;  TC 237, TG 123, HDL 52, LDL 160 ? ? ?Current Outpatient Medications  ?Medication Sig Dispense Refill  ? amLODipine (NORVASC) 10 MG tablet Take 10 mg by mouth daily.    ? aspirin 81 MG chewable tablet Chew 81 mg by mouth 3 (three) times a week.    ? Ibuprofen 200 MG CAPS Take by mouth as needed.    ? Multiple Vitamins-Minerals (MULTI FOR HIM 50+) TABS Take 1 tablet by mouth daily.    ? OMEGA 3 1000 MG CAPS Take 1,000 mg by mouth daily.     ? Red Yeast Rice 600 MG CAPS Take 600 mg by mouth daily.     ? ?No current facility-administered medications for this visit.  ? ? ?Allergies  ?Allergen Reactions  ? Linzess [Linaclotide] Other (See Comments)  ?  Pt. States that linzess caused considerable dizziness, and inabillity to drive.  ? Morphine And Related Nausea And Vomiting  ? Olmesartan   ?  Other reaction(s): constipation  ?  Statins   ?  Flulike symptoms  ? ? ?Past Medical History:  ?Diagnosis Date  ? AVM (arteriovenous malformation)   ? of cecum  ? Bladder stone   ? Calcification of abdominal aorta (HCC)   ? Cholelithiasis   ? Diverticulosis   ? Hyperlipemia   ? Hypertension   ? Renal cyst   ? Shingles   ? ? ?Blood pressure (!) 160/98, pulse 86, resp. rate 16, height 5\' 8"  (1.727 m), weight 178 lb (80.7 kg), SpO2 96 %. ? ? ?Hyperlipidemia ?Patient with CAD by calcium score and LDL not at goal.  Reviewed options for lowering LDL cholesterol, including ezetimibe, PCSK-9 inhibitors, bempedoic acid and inclisiran.  Discussed mechanisms of action, dosing, side effects and potential decreases in LDL cholesterol.  Also reviewed cost information and potential options for patient assistance.  Answered all patient questions.  Based on this information, patient would prefer to start Repatha 140 mg every 2 weeks.  Will repeat cholesterol labs after 2-3 months.   ? ? ?Tommy Medal PharmD CPP [facility] ?[place] ?[address] ?[place], [place] [address] ?[phone] ? ? ? ?

## 2021-10-09 NOTE — Telephone Encounter (Signed)
Called and lmom the pcp office for lipid panel most recent  ?

## 2021-10-11 ENCOUNTER — Telehealth: Payer: Self-pay

## 2021-10-11 DIAGNOSIS — E785 Hyperlipidemia, unspecified: Secondary | ICD-10-CM

## 2021-10-11 NOTE — Telephone Encounter (Signed)
Chris Tucker (KeyAdelina Mings) [id number] ?Repatha SureClick 140MG /ML auto-injectors ?Status: PA Request ?Created: May 4th, 2023 ?Sent: May 4th, 2023 ? ?Lipid &hepatic panel ordered and released ? ?Will contact pt once approved ?

## 2021-10-11 NOTE — Telephone Encounter (Signed)
-----   Message from Rosalee Kaufman, RPH-CPP sent at 10/11/2021  9:57 AM EDT ----- ?Regarding: Repatha PA ?Please do PA for Repatha with labs after 4-6 doses. ? ?Thank you! ? ? ?

## 2021-10-12 MED ORDER — REPATHA SURECLICK 140 MG/ML ~~LOC~~ SOAJ: 140.0000 mg | SUBCUTANEOUS | 12 refills | Status: DC

## 2021-10-12 NOTE — Addendum Note (Signed)
Addended by: Rosalee Kaufman on: 10/12/2021 08:49 AM ? ? Modules accepted: Orders ? ?

## 2021-10-12 NOTE — Telephone Encounter (Signed)
Spoke with patient - Repatha approved and sent to local pharmacy.  Repeat labs in 2-3 months.   ?

## 2021-10-22 ENCOUNTER — Telehealth: Payer: Self-pay | Admitting: Cardiovascular Disease

## 2021-10-22 NOTE — Telephone Encounter (Signed)
Pt c/o medication issue: ? ?1. Name of Medication: Evolocumab (REPATHA SURECLICK) 140 MG/ML SOAJ ? ?2. How are you currently taking this medication (dosage and times per day)? Suppose to take first dose this evening  ? ?3. Are you having a reaction (difficulty breathing--STAT)? No ? ?4. What is your medication issue? Pt is needing call back in regards to a letter he received today about results. Needing med clarification after reading letter.  ?

## 2021-10-23 NOTE — Telephone Encounter (Signed)
Returned call to pt. States he received stress test results that showed no ischemia so he's wondering if he still needs to take Repatha. Advised him yes based on his elevated calcium score he should continue Repatha. His LDL goal is < 70 because of this. Reports he gave his first injection last night and that it was very easy. ? ?Also asks whether he should continue taking his OTC red yeast rice 600mg  daily and fish oil 1,000mg  daily. Advised him he can stop these. He was appreciative for the call. ?

## 2021-12-31 LAB — HEPATIC FUNCTION PANEL
ALT: 21 IU/L (ref 0–44)
AST: 22 IU/L (ref 0–40)
Albumin: 4.3 g/dL (ref 3.8–4.8)
Alkaline Phosphatase: 80 IU/L (ref 44–121)
Bilirubin Total: 0.8 mg/dL (ref 0.0–1.2)
Bilirubin, Direct: 0.2 mg/dL (ref 0.00–0.40)
Total Protein: 6.2 g/dL (ref 6.0–8.5)

## 2021-12-31 LAB — LIPID PANEL
Chol/HDL Ratio: 2.8 ratio (ref 0.0–5.0)
Cholesterol, Total: 141 mg/dL (ref 100–199)
HDL: 51 mg/dL (ref >39)
LDL Chol Calc (NIH): 73 mg/dL (ref 0–99)
Triglycerides: 91 mg/dL (ref 0–149)
VLDL Cholesterol Cal: 17 mg/dL (ref 5–40)

## 2022-03-23 ENCOUNTER — Telehealth: Payer: Self-pay | Admitting: Pharmacist Clinician (PhC)/ Clinical Pharmacy Specialist

## 2022-03-23 NOTE — Telephone Encounter (Signed)
Repatha PA renewal sent to [facility] 10/14  Key: [id number]

## 2022-03-25 MED ORDER — REPATHA SURECLICK 140 MG/ML ~~LOC~~ SOAJ: 140.0000 mg | SUBCUTANEOUS | 12 refills | Status: DC

## 2022-03-25 NOTE — Telephone Encounter (Signed)
PA approved to 12.31.2024  

## 2022-08-21 LAB — LAB REPORT - SCANNED: EGFR: 67

## 2022-09-09 ENCOUNTER — Telehealth: Payer: Self-pay | Admitting: Cardiovascular Disease

## 2022-09-09 NOTE — Telephone Encounter (Signed)
Pt would like a callback to get more info on [employer] to help with getting assistance for his   Evolocumab ([place]) [id number] MG/ML SOAJ  . Please advise.

## 2022-09-09 NOTE — Telephone Encounter (Signed)
Spoke to patient's wife she stated husband would like to see about getting assistance for Repatha.Advised I will send message to our pharmacist.

## 2022-09-09 NOTE — Telephone Encounter (Signed)
Spoke with patient.  His brother is also on Repatha but gets free 2.2 [employer].  Reviewed criteria for application, Chris Tucker notes that his wife is still working and their income is > $100,000.  He is not concerned at this time about his co-pays, but just wanted to see if he would qualify.

## 2022-10-30 ENCOUNTER — Other Ambulatory Visit: Payer: Self-pay | Admitting: Cardiovascular Disease

## 2023-01-31 ENCOUNTER — Ambulatory Visit: Payer: Medicare Other | Attending: Cardiovascular Disease | Admitting: Cardiovascular Disease

## 2023-01-31 ENCOUNTER — Encounter: Payer: Self-pay | Admitting: Cardiovascular Disease

## 2023-01-31 VITALS — BP 138/82 | HR 79 | Ht 67.0 in | Wt 173.4 lb

## 2023-01-31 DIAGNOSIS — I10 Essential (primary) hypertension: Secondary | ICD-10-CM

## 2023-01-31 DIAGNOSIS — E782 Mixed hyperlipidemia: Secondary | ICD-10-CM | POA: Diagnosis not present

## 2023-01-31 DIAGNOSIS — R931 Abnormal findings on diagnostic imaging of heart and coronary circulation: Secondary | ICD-10-CM | POA: Diagnosis not present

## 2023-01-31 NOTE — Progress Notes (Signed)
01/31/2023 Chris Tucker   08/31/1950  [id number]  Primary Physician Larose Hires Gaylyn Lambert, FNP Primary Cardiologist: Runell Gess MD Nicholes Calamity, [place]  HPI:  Chris Tucker is a 72 y.o.  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 He was referred by Lauretta Chester  FNP because of an abnormal coronary calcium score. His cardiovascular risk factor profile is notable for treated hypertension, untreated hyperlipidemia because of statin intolerance. There has no family history for heart disease.  He has never had a heart attack or stroke. He denies chest pain or shortness of breath. He is fairly active working on his land splitting wood, cutting down trees and doing yard work. He had a coronary calcium score performed 09/12/21 which was 1089 with calcium distributed throughout his coronary tree.  Since I saw him a year ago he has had a Myoview stress test performed 10/09/2021 which was entirely normal.  He continues to be very active and asymptomatic.  He was begun on Repatha and has had excellent lipid profile as result.   Current Meds  Medication Sig   amLODipine (NORVASC) 10 MG tablet Take 10 mg by mouth daily.   aspirin 81 MG chewable tablet Chew 81 mg by mouth 3 (three) times a week.   Ibuprofen 200 MG CAPS Take by mouth as needed.   Multiple Vitamins-Minerals (MULTI FOR HIM 50+) TABS Take 1 tablet by mouth daily.   REPATHA SURECLICK 140 MG/ML SOAJ ADMINISTER 1 ML UNDER THE SKIN EVERY 14 DAYS     Allergies  Allergen Reactions   Linzess [Linaclotide] Other (See Comments)    Pt. States that linzess caused considerable dizziness, and inabillity to drive.   Morphine And Codeine Nausea And Vomiting   Olmesartan     Other reaction(s): constipation   Statins     Flulike symptoms    Social History   Socioeconomic History   Marital status: Married    Spouse name: Not on file   Number  of children: 1   Years of education: Not on file   Highest education level: Not on file  Occupational History   Occupation: Retired  Tobacco Use   Smoking status: Former   Smokeless tobacco: Never  Substance and Sexual Activity   Alcohol use: Yes    Comment: occasionally   Drug use: No   Sexual activity: Not on file  Other Topics Concern   Not on file  Social History Narrative   Not on file   Social Determinants of Health   Financial Resource Strain: Not on file  Food Insecurity: Not on file  Transportation Needs: Not on file  Physical Activity: Not on file  Stress: Not on file  Social Connections: Not on file  Intimate Partner Violence: Not on file     Review of Systems: General: negative for chills, fever, night sweats or weight changes.  Cardiovascular: negative for chest pain, dyspnea on exertion, edema, orthopnea, palpitations, paroxysmal nocturnal dyspnea or shortness of breath Dermatological: negative for rash Respiratory: negative for cough or wheezing Urologic: negative for hematuria Abdominal: negative for nausea, vomiting, diarrhea, bright red blood per rectum, melena, or hematemesis Neurologic: negative for visual changes, syncope, or dizziness All other systems reviewed and are otherwise negative except as noted above.    Blood pressure 138/82, pulse 79, height 5\' 7"  (1.702 m), weight 173 lb 6.4 oz (78.7 kg), SpO2  96%.  General appearance: alert and no distress Neck: no adenopathy, no carotid bruit, no JVD, supple, symmetrical, trachea midline, and thyroid not enlarged, symmetric, no tenderness/mass/nodules Lungs: clear to auscultation bilaterally Heart: Regular rate and rhythm without murmurs, rubs or clicks Extremities: extremities normal, atraumatic, no cyanosis or edema Pulses: 2+ and symmetric Skin: Skin color, texture, turgor normal. No rashes or lesions Neurologic: Grossly normal  EKG EKG Interpretation Date/Time:  Friday January 31 2023  10:11:24 EDT Ventricular Rate:  67 PR Interval:  134 QRS Duration:  112 QT Interval:  420 QTC Calculation: 443 R Axis:   -60  Text Interpretation: Undetermined rhythm Left axis deviation Incomplete right bundle branch block Cannot rule out Anterior infarct , age undetermined When compared with ECG of 30-Mar-2003 14:51, Current undetermined rhythm precludes rhythm comparison, needs review Incomplete right bundle branch block is now Present Confirmed by Nanetta Batty [phone]) on 01/31/2023 10:51:54 AM    ASSESSMENT AND PLAN:   Essential hypertension History of essential hypertension with blood pressure measured today at 138/82.  He is on amlodipine.  Hyperlipidemia History of hyperlipidemia on Repatha with lipid profile performed 12/31/2021 revealing a total cholesterol of 41, LDL of 73 and HDL 51.  Elevated coronary artery calcium score Elevated coronary calcium score of 1089 performed 09/13/2021 with subsequent Myoview stress test performed 10/09/2021 that was complete normal.  He is active and asymptomatic.  He is at goal with regards to lipid profile for secondary prevention.     Runell Gess MD FACP,FACC,FAHA, [facility] 01/31/2023 11:06 AM

## 2023-01-31 NOTE — Assessment & Plan Note (Signed)
Elevated coronary calcium score of 1089 performed 09/13/2021 with subsequent Myoview stress test performed 10/09/2021 that was complete normal.  He is active and asymptomatic.  He is at goal with regards to lipid profile for secondary prevention.

## 2023-01-31 NOTE — Patient Instructions (Signed)
Medication Instructions:  Your physician recommends that you continue on your current medications as directed. Please refer to the Current Medication list given to you today.  *If you need a refill on your cardiac medications before your next appointment, please call your pharmacy*    Follow-Up: At [facility], you and your health needs are our priority.  As part of our continuing mission to provide you with exceptional heart care, we have created designated Provider Care Teams.  These Care Teams include your primary Cardiologist (physician) and Advanced Practice Providers (APPs -  Physician Assistants and Nurse Practitioners) who all work together to provide you with the care you need, when you need it.  We recommend signing up for the patient portal called "MyChart".  Sign up information is provided on this After Visit Summary.  MyChart is used to connect with patients for Virtual Visits (Telemedicine).  Patients are able to view lab/test results, encounter notes, upcoming appointments, etc.  Non-urgent messages can be sent to your provider as well.   To learn more about what you can do with MyChart, go to [email or web address].    Your next appointment:    12 months with Dr. Allyson Sabal

## 2023-01-31 NOTE — Assessment & Plan Note (Addendum)
History of hyperlipidemia on Repatha with lipid profile performed 12/31/2021 revealing a total cholesterol of 41, LDL of 73 and HDL 51.

## 2023-01-31 NOTE — Assessment & Plan Note (Signed)
History of essential hypertension with blood pressure measured today at 138/82.  He is on amlodipine.

## 2023-02-19 LAB — LAB REPORT - SCANNED: EGFR: 67

## 2023-04-22 ENCOUNTER — Encounter: Payer: Self-pay | Admitting: Registered Nurse

## 2023-08-19 LAB — LAB REPORT - SCANNED: EGFR: 71

## 2023-11-14 ENCOUNTER — Other Ambulatory Visit: Payer: Self-pay | Admitting: Cardiovascular Disease

## 2024-02-03 ENCOUNTER — Other Ambulatory Visit: Payer: Self-pay | Admitting: Urology

## 2024-02-03 ENCOUNTER — Encounter (HOSPITAL_COMMUNITY): Payer: Self-pay | Admitting: Urology

## 2024-02-03 NOTE — Progress Notes (Signed)
 Spoke w/ via phone for pre-op interview---Chris Tucker needs dos---KUB              COVID test -----patient states asymptomatic no test needed Arrive at -------0645 NPO after MN NO Solid Food.  Clear liquids from MN until--- Med rec completed. Pt aware to hold ASA/NSAIDs and supplements per [facility] protocol.  Medications to take morning of surgery -----none Diabetic/Weight loss medication ----- No Alcohol or recreational drugs for 24 hours/Tobacco products for 6 hours ---- Patient instructed to bring blue lithotripsy folder, photo id and insurance card day of surgery. Patient aware to have Driver (ride ) / caregiver for 24 hours after surgery -----Chris Tucker [phone] [id number] Patient Special Instructions -----bring blue folder and wear clothes without metal belt buckles, buttons, or zippers. Pre-Op special Instructions ----- take laxative of choice day before procedure Patient verbalized understanding of instructions that were given at this phone interview. Patient denies shortness of breath, chest pain, fever, cough at this phone interview.

## 2024-02-04 ENCOUNTER — Ambulatory Visit: Attending: Cardiovascular Disease | Admitting: Cardiovascular Disease

## 2024-02-04 ENCOUNTER — Encounter: Payer: Self-pay | Admitting: Cardiovascular Disease

## 2024-02-04 VITALS — BP 140/84 | HR 89 | Ht 67.0 in | Wt 167.0 lb

## 2024-02-04 DIAGNOSIS — I10 Essential (primary) hypertension: Secondary | ICD-10-CM | POA: Diagnosis not present

## 2024-02-04 DIAGNOSIS — R931 Abnormal findings on diagnostic imaging of heart and coronary circulation: Secondary | ICD-10-CM

## 2024-02-04 DIAGNOSIS — E782 Mixed hyperlipidemia: Secondary | ICD-10-CM | POA: Diagnosis not present

## 2024-02-04 NOTE — Assessment & Plan Note (Signed)
 Elevated coronary calcium score of 1089 performed/6/23.  Subsequent Myoview  stress test performed 10/09/2021 was normal.  He is active and asymptomatic, and at goal for secondary prevention.

## 2024-02-04 NOTE — Patient Instructions (Signed)

## 2024-02-04 NOTE — Assessment & Plan Note (Signed)
 History of hyperlipidemia on Repatha  with lipid profile performed 08/18/2023 revealing total cholesterol 146, LDL of 72 and HDL 58.

## 2024-02-04 NOTE — Assessment & Plan Note (Signed)
 History of essential hypertension with blood pressure measured today at 160/80.  He is on amlodipine.  He says this probably represents whitecoat hypertension.  He checks his blood pressure at home and it usually within normal limits.

## 2024-02-04 NOTE — Progress Notes (Signed)
 02/04/2024 Chris Tucker   1950/06/18  [id number]  Primary Physician Royden Ronal Czar, FNP Primary Cardiologist: Dorn JINNY Lesches MD GENI CODY MADE[place], [place]  HPI:  Chris Tucker is a 73 y.o.   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 He was referred by Ronal Royden  FNP because of an abnormal coronary calcium score.  I last saw him in the office 01/31/2023 his cardiovascular risk factor profile is notable for treated hypertension, untreated hyperlipidemia because of statin intolerance. There has no family history for heart disease.  He has never had a heart attack or stroke. He denies chest pain or shortness of breath. He is fairly active working on his land splitting wood, cutting down trees and doing yard work. He had a coronary calcium score performed 09/12/21 which was 1089 with calcium distributed throughout his coronary tree.   He had a Myoview  stress test performed 10/09/2021 which was entirely normal.  He continues to be very active and asymptomatic.  He was begun on Repatha  and has had excellent lipid profile as result  Since I saw him a year ago he is doing well.  He remains active and asymptomatic with an excellent lipid profile.  He is actually scheduled to undergo lithotripsy for a kidney stone later this week.   Current Meds  Medication Sig   amLODipine (NORVASC) 10 MG tablet Take 10 mg by mouth daily.   Evolocumab  (REPATHA  SURECLICK) 140 MG/ML SOAJ ADMINISTER 1 ML UNDER THE SKIN EVERY 14 DAYS   Ibuprofen 200 MG CAPS Take by mouth as needed.   Multiple Vitamins-Minerals (MULTI FOR HIM 50+) TABS Take 1 tablet by mouth daily.     Allergies  Allergen Reactions   Linzess  [Linaclotide ] Other (See Comments)    Pt. States that linzess  caused considerable dizziness, and inabillity to drive.   Morphine And Codeine Nausea And Vomiting   Olmesartan     Other reaction(s): constipation    Statins     Flulike symptoms    Social History   Socioeconomic History   Marital status: Married    Spouse name: Not on file   Number of children: 1   Years of education: Not on file   Highest education level: Not on file  Occupational History   Occupation: Retired  Tobacco Use   Smoking status: Former    Types: Cigarettes   Smokeless tobacco: Never  Vaping Use   Vaping status: Never Used  Substance and Sexual Activity   Alcohol use: Yes    Comment: occasionally   Drug use: No   Sexual activity: Not on file  Other Topics Concern   Not on file  Social History Narrative   Not on file   Social Drivers of Health   Financial Resource Strain: Not on file  Food Insecurity: Not on file  Transportation Needs: Not on file  Physical Activity: Not on file  Stress: Not on file  Social Connections: Not on file  Intimate Partner Violence: Not on file     Review of Systems: General: negative for chills, fever, night sweats or weight changes.  Cardiovascular: negative for chest pain, dyspnea on exertion, edema, orthopnea, palpitations, paroxysmal nocturnal dyspnea or shortness of breath Dermatological: negative for rash Respiratory: negative for cough or wheezing Urologic: negative for hematuria Abdominal: negative for nausea, vomiting, diarrhea, bright red blood per rectum, melena, or hematemesis Neurologic: negative  for visual changes, syncope, or dizziness All other systems reviewed and are otherwise negative except as noted above.    Blood pressure (!) 160/80, pulse 89, height 5' 7 (1.702 m), weight 167 lb (75.8 kg), SpO2 96%.  General appearance: alert and no distress Neck: no adenopathy, no carotid bruit, no JVD, supple, symmetrical, trachea midline, and thyroid not enlarged, symmetric, no tenderness/mass/nodules Lungs: clear to auscultation bilaterally Heart: regular rate and rhythm, S1, S2 normal, no murmur, click, rub or gallop Extremities: extremities normal,  atraumatic, no cyanosis or edema Pulses: 2+ and symmetric Skin: Skin color, texture, turgor normal. No rashes or lesions Neurologic: Grossly normal  EKG EKG Interpretation Date/Time:  Wednesday February 04 2024 13:21:29 EDT Ventricular Rate:  89 PR Interval:  160 QRS Duration:  114 QT Interval:  378 QTC Calculation: 459 R Axis:   -61  Text Interpretation: Normal sinus rhythm Incomplete right bundle branch block Left anterior fascicular block Minimal voltage criteria for LVH, may be normal variant ( R in aVL ) When compared with ECG of 31-Jan-2023 10:11, Previous ECG has undetermined rhythm, needs review Confirmed by Court Carrier [phone]) on 02/04/2024 1:26:28 PM    ASSESSMENT AND PLAN:   Essential hypertension History of essential hypertension with blood pressure measured today at 160/80.  He is on amlodipine.  He says this probably represents whitecoat hypertension.  He checks his blood pressure at home and it usually within normal limits.  Hyperlipidemia History of hyperlipidemia on Repatha  with lipid profile performed 08/18/2023 revealing total cholesterol 146, LDL of 72 and HDL 58.  Elevated coronary artery calcium score Elevated coronary calcium score of 1089 performed/6/23.  Subsequent Myoview  stress test performed 10/09/2021 was normal.  He is active and asymptomatic, and at goal for secondary prevention.     Carrier DOROTHA Court MD FACP,FACC,FAHA, [facility] 02/04/2024 1:35 PM

## 2024-02-06 ENCOUNTER — Encounter (HOSPITAL_COMMUNITY): Payer: Self-pay | Admitting: Urology

## 2024-02-06 ENCOUNTER — Ambulatory Visit (HOSPITAL_COMMUNITY)
Admission: RE | Admit: 2024-02-06 | Discharge: 2024-02-06 | Disposition: A | Discharge status: Home / Self Care | Attending: Urology | Admitting: Urology

## 2024-02-06 ENCOUNTER — Other Ambulatory Visit: Payer: Self-pay

## 2024-02-06 ENCOUNTER — Encounter (HOSPITAL_COMMUNITY)
Admission: RE | Disposition: A | Discharge status: Home / Self Care | Payer: Self-pay | Source: Home / Self Care | Attending: Urology

## 2024-02-06 ENCOUNTER — Ambulatory Visit (HOSPITAL_COMMUNITY)

## 2024-02-06 DIAGNOSIS — Z87442 Personal history of urinary calculi: Secondary | ICD-10-CM | POA: Insufficient documentation

## 2024-02-06 DIAGNOSIS — N202 Calculus of kidney with calculus of ureter: Secondary | ICD-10-CM | POA: Insufficient documentation

## 2024-02-06 DIAGNOSIS — N200 Calculus of kidney: Secondary | ICD-10-CM | POA: Diagnosis present

## 2024-02-06 HISTORY — DX: Personal history of urinary calculi: Z87.442

## 2024-02-06 HISTORY — PX: EXTRACORPOREAL SHOCK WAVE LITHOTRIPSY: SHX1557

## 2024-02-06 SURGERY — LITHOTRIPSY, ESWL
Anesthesia: LOCAL | Laterality: Left

## 2024-02-06 MED ORDER — DIAZEPAM 5 MG PO TABS: 10.0000 mg | ORAL_TABLET | ORAL | Status: DC

## 2024-02-06 MED ORDER — TAMSULOSIN HCL 0.4 MG PO CAPS: 0.4000 mg | ORAL_CAPSULE | Freq: Every day | ORAL | 0 refills | Status: AC

## 2024-02-06 MED ORDER — DIPHENHYDRAMINE HCL 25 MG PO CAPS
25.0000 mg | ORAL_CAPSULE | ORAL | Status: AC
  Administered 2024-02-06: 25 mg via ORAL
  Filled 2024-02-06: qty 1

## 2024-02-06 MED ORDER — CIPROFLOXACIN HCL 500 MG PO TABS
500.0000 mg | ORAL_TABLET | ORAL | Status: AC
  Administered 2024-02-06: 500 mg via ORAL
  Filled 2024-02-06: qty 1

## 2024-02-06 MED ORDER — SODIUM CHLORIDE 0.9 % IV SOLN: INTRAVENOUS | Status: DC

## 2024-02-06 MED ORDER — DIAZEPAM 5 MG PO TABS
5.0000 mg | ORAL_TABLET | ORAL | Status: AC
  Administered 2024-02-06: 5 mg via ORAL
  Filled 2024-02-06: qty 1

## 2024-02-06 MED ORDER — TRAMADOL HCL 50 MG PO TABS: 50.0000 mg | ORAL_TABLET | Freq: Four times a day (QID) | ORAL | 0 refills | Status: AC | PRN

## 2024-02-06 NOTE — Op Note (Signed)
 See [employer] operative note scanned into chart. Also because of the size, density, location and other factors that cannot be anticipated I feel this will likely be a staged procedure. This fact supersedes any indication in the scanned [place] stone operative note to the contrary.  Herlene Foot MD 02/06/2024, 9:31 AM  [facility]  Pager: [phone]

## 2024-02-06 NOTE — H&P (Signed)
 H&P  History of Present Illness: Chris Tucker is a 73 y.o. year old M who presents today for treatment of a left renal stone  No acute complaints  Past Medical History:  Diagnosis Date   AVM (arteriovenous malformation)    of cecum   Bladder stone    Calcification of abdominal aorta (HCC)    Cholelithiasis    Diverticulosis    History of kidney stones    Hyperlipemia    Hypertension    Renal cyst    Shingles     Past Surgical History:  Procedure Laterality Date   KNEE SURGERY  2005   testicular torsion  1984   TORSION APPENDIX EXCISION OF INFRACTED EPIPLOIC     WISDOM TOOTH EXTRACTION      Home Medications:  Current Meds  Medication Sig   amLODipine (NORVASC) 10 MG tablet Take 10 mg by mouth daily.   Evolocumab  (REPATHA  SURECLICK) 140 MG/ML SOAJ ADMINISTER 1 ML UNDER THE SKIN EVERY 14 DAYS   Ibuprofen 200 MG CAPS Take by mouth as needed.   Multiple Vitamins-Minerals (MULTI FOR HIM 50+) TABS Take 1 tablet by mouth daily.    Allergies:  Allergies  Allergen Reactions   Linzess  [Linaclotide ] Other (See Comments)    Pt. States that linzess  caused considerable dizziness, and inabillity to drive.   Morphine And Codeine Nausea And Vomiting   Olmesartan     Other reaction(s): constipation   Statins     Flulike symptoms    Family History  Problem Relation Age of Onset   Bone cancer Father    Ovarian cancer Mother     Social History:  reports that he has quit smoking. His smoking use included cigarettes. He has never used smokeless tobacco. He reports current alcohol use. He reports that he does not use drugs.  ROS: A complete review of systems was performed.  All systems are negative except for pertinent findings as noted.  Physical Exam:  Vital signs in last 24 hours: Temp:  [98.2 F (36.8 C)] 98.2 F (36.8 C) (08/29 0721) Pulse Rate:  [74] 74 (08/29 0721) Resp:  [16] 16 (08/29 0721) BP: (162)/(88) 162/88 (08/29 0721) SpO2:  [97 %] 97 % (08/29  0721) Weight:  [73.5 kg] 73.5 kg (08/29 0721) Constitutional:  Alert and oriented, No acute distress Cardiovascular: Regular rate and rhythm Respiratory: Normal respiratory effort, Lungs clear bilaterally GI: Abdomen is soft, nontender, nondistended, no abdominal masses Lymphatic: No lymphadenopathy Neurologic: Grossly intact, no focal deficits Psychiatric: Normal mood and affect   Laboratory Data:  No results for input(s): WBC, HGB, HCT, PLT in the last 72 hours.  No results for input(s): NA, K, CL, GLUCOSE, BUN, CALCIUM, CREATININE in the last 72 hours.  Invalid input(s): CO3   No results found for this or any previous visit (from the past 24 hours). No results found for this or any previous visit (from the past 240 hours).  Renal Function: No results for input(s): CREATININE in the last 168 hours. CrCl cannot be calculated (Patient's most recent lab result is older than the maximum 21 days allowed.).  Radiologic Imaging: No results found.  Assessment:  Chris Tucker is a 73 y.o. year old M with left renal stone   Plan:  --to OR as planned for left  ESWL. Procedure and risks reviewed, including but not limited to hematuria, infection, sepsis, damage to GU tract, failure to complete procedure, retained stone fragments, need for future procedures, steinstrasse, pain, failure to pass fragments  Herlene Foot, MD 02/06/2024, 7:39 AM  [facility] Pager: [phone]

## 2024-02-06 NOTE — Discharge Instructions (Addendum)
 1. You should strain your urine and collect all fragments and bring them to your follow up appointment.  2. You should take your pain medication as needed.  Please call if your pain is severe to the point that it is not controlled with your pain medication. 3. You should call if you develop fever > 101 or persistent nausea or vomiting. 4. Your doctor may prescribe tamsulosin  to take to help facilitate stone passage.

## 2024-02-10 ENCOUNTER — Encounter (HOSPITAL_COMMUNITY): Payer: Self-pay | Admitting: Urology

## 2024-02-23 LAB — LAB REPORT - SCANNED: EGFR: 63

## 2024-03-01 ENCOUNTER — Encounter: Payer: Self-pay | Admitting: Registered Nurse

## 2024-03-01 ENCOUNTER — Ambulatory Visit: Payer: Self-pay | Admitting: Cardiovascular Disease

## 2024-03-09 ENCOUNTER — Other Ambulatory Visit: Payer: Self-pay | Admitting: Orthopaedic Surgery

## 2024-03-09 DIAGNOSIS — M25512 Pain in left shoulder: Secondary | ICD-10-CM

## 2024-03-18 ENCOUNTER — Ambulatory Visit
Admission: RE | Admit: 2024-03-18 | Discharge: 2024-03-18 | Disposition: A | Discharge status: Home / Self Care | Source: Ambulatory Visit | Attending: Orthopaedic Surgery | Admitting: Orthopaedic Surgery

## 2024-03-18 DIAGNOSIS — M25512 Pain in left shoulder: Secondary | ICD-10-CM
